# Patient Record
Sex: Male | Born: 1960 | Race: Black or African American | Hispanic: No | Marital: Married | Smoking: Current every day smoker
Health system: Southern US, Community
[De-identification: ages and names within clinical notes are randomized; demographics above are authoritative.]

## PROBLEM LIST (undated history)

## (undated) DIAGNOSIS — I10 Essential (primary) hypertension: Secondary | ICD-10-CM

## (undated) DIAGNOSIS — E7800 Pure hypercholesterolemia, unspecified: Secondary | ICD-10-CM

## (undated) DIAGNOSIS — M1990 Unspecified osteoarthritis, unspecified site: Secondary | ICD-10-CM

## (undated) HISTORY — PX: HIP SURGERY: SHX245

## (undated) HISTORY — DX: Unspecified osteoarthritis, unspecified site: M19.90

## (undated) HISTORY — PX: KNEE ARTHROSCOPY: SUR90

---

## 1998-03-03 ENCOUNTER — Encounter: Admission: RE | Admit: 1998-03-03 | Discharge: 1998-06-01 | Payer: Self-pay | Admitting: Family Medicine

## 1999-05-27 ENCOUNTER — Ambulatory Visit (HOSPITAL_COMMUNITY): Admission: RE | Admit: 1999-05-27 | Discharge: 1999-05-27 | Payer: Self-pay | Admitting: Internal Medicine

## 2002-05-31 ENCOUNTER — Emergency Department (HOSPITAL_COMMUNITY): Admission: EM | Admit: 2002-05-31 | Discharge: 2002-05-31 | Payer: Self-pay | Admitting: *Deleted

## 2004-10-31 ENCOUNTER — Emergency Department (HOSPITAL_COMMUNITY): Admission: EM | Admit: 2004-10-31 | Discharge: 2004-10-31 | Payer: Self-pay | Admitting: Emergency Medicine

## 2004-11-24 ENCOUNTER — Emergency Department (HOSPITAL_COMMUNITY): Admission: EM | Admit: 2004-11-24 | Discharge: 2004-11-24 | Payer: Self-pay | Admitting: Emergency Medicine

## 2005-06-12 ENCOUNTER — Emergency Department (HOSPITAL_COMMUNITY): Admission: EM | Admit: 2005-06-12 | Discharge: 2005-06-12 | Payer: Self-pay | Admitting: Emergency Medicine

## 2005-06-29 ENCOUNTER — Inpatient Hospital Stay (HOSPITAL_COMMUNITY): Admission: EM | Admit: 2005-06-29 | Discharge: 2005-07-05 | Payer: Self-pay | Admitting: Emergency Medicine

## 2005-07-08 ENCOUNTER — Ambulatory Visit: Payer: Self-pay | Admitting: Family Medicine

## 2005-07-16 ENCOUNTER — Ambulatory Visit: Payer: Self-pay | Admitting: Family Medicine

## 2005-07-17 ENCOUNTER — Encounter: Admission: RE | Admit: 2005-07-17 | Discharge: 2005-07-17 | Payer: Self-pay

## 2005-08-06 ENCOUNTER — Ambulatory Visit: Payer: Self-pay | Admitting: Family Medicine

## 2005-08-07 ENCOUNTER — Ambulatory Visit: Payer: Self-pay | Admitting: *Deleted

## 2005-10-28 ENCOUNTER — Ambulatory Visit: Payer: Self-pay | Admitting: Family Medicine

## 2005-12-10 ENCOUNTER — Ambulatory Visit: Payer: Self-pay | Admitting: Family Medicine

## 2006-04-16 ENCOUNTER — Ambulatory Visit: Payer: Self-pay | Admitting: Family Medicine

## 2006-10-02 ENCOUNTER — Ambulatory Visit: Payer: Self-pay | Admitting: Family Medicine

## 2006-12-03 ENCOUNTER — Ambulatory Visit: Payer: Self-pay | Admitting: Family Medicine

## 2007-01-23 ENCOUNTER — Emergency Department (HOSPITAL_COMMUNITY): Admission: EM | Admit: 2007-01-23 | Discharge: 2007-01-23 | Payer: Self-pay | Admitting: Emergency Medicine

## 2007-06-10 ENCOUNTER — Encounter (INDEPENDENT_AMBULATORY_CARE_PROVIDER_SITE_OTHER): Payer: Self-pay | Admitting: *Deleted

## 2007-07-06 ENCOUNTER — Emergency Department (HOSPITAL_COMMUNITY): Admission: EM | Admit: 2007-07-06 | Discharge: 2007-07-06 | Payer: Self-pay | Admitting: Emergency Medicine

## 2007-07-17 ENCOUNTER — Ambulatory Visit: Payer: Self-pay | Admitting: Family Medicine

## 2007-07-17 ENCOUNTER — Encounter (INDEPENDENT_AMBULATORY_CARE_PROVIDER_SITE_OTHER): Payer: Self-pay | Admitting: Family Medicine

## 2007-07-17 LAB — CONVERTED CEMR LAB
ALT: 10 units/L (ref 0–53)
AST: 12 units/L (ref 0–37)
Albumin: 4.6 g/dL (ref 3.5–5.2)
Alkaline Phosphatase: 85 units/L (ref 39–117)
BUN: 15 mg/dL (ref 6–23)
CO2: 23 meq/L (ref 19–32)
Calcium: 9.6 mg/dL (ref 8.4–10.5)
Chloride: 102 meq/L (ref 96–112)
Cholesterol: 238 mg/dL — ABNORMAL HIGH (ref 0–200)
Creatinine, Ser: 0.98 mg/dL (ref 0.40–1.50)
Glucose, Bld: 78 mg/dL (ref 70–99)
HDL: 38 mg/dL — ABNORMAL LOW (ref >39)
LDL Cholesterol: 154 mg/dL — ABNORMAL HIGH (ref 0–99)
Potassium: 4.7 meq/L (ref 3.5–5.3)
Sodium: 137 meq/L (ref 135–145)
Total Bilirubin: 0.5 mg/dL (ref 0.3–1.2)
Total CHOL/HDL Ratio: 6.3
Total Protein: 7.7 g/dL (ref 6.0–8.3)
Triglycerides: 228 mg/dL — ABNORMAL HIGH (ref <150)
VLDL: 46 mg/dL — ABNORMAL HIGH (ref 0–40)

## 2007-08-14 ENCOUNTER — Ambulatory Visit: Payer: Self-pay | Admitting: Internal Medicine

## 2007-08-28 ENCOUNTER — Ambulatory Visit: Payer: Self-pay | Admitting: Family Medicine

## 2007-10-25 ENCOUNTER — Emergency Department (HOSPITAL_COMMUNITY): Admission: EM | Admit: 2007-10-25 | Discharge: 2007-10-25 | Payer: Self-pay | Admitting: Emergency Medicine

## 2008-02-18 ENCOUNTER — Encounter (INDEPENDENT_AMBULATORY_CARE_PROVIDER_SITE_OTHER): Payer: Self-pay | Admitting: Family Medicine

## 2008-02-18 ENCOUNTER — Ambulatory Visit: Payer: Self-pay | Admitting: Family Medicine

## 2008-02-18 LAB — CONVERTED CEMR LAB: Microalb, Ur: 0.89 mg/dL (ref 0.00–1.89)

## 2008-03-24 ENCOUNTER — Ambulatory Visit: Payer: Self-pay | Admitting: Internal Medicine

## 2008-04-03 ENCOUNTER — Emergency Department (HOSPITAL_COMMUNITY): Admission: EM | Admit: 2008-04-03 | Discharge: 2008-04-03 | Payer: Self-pay | Admitting: Emergency Medicine

## 2009-02-18 ENCOUNTER — Emergency Department (HOSPITAL_COMMUNITY): Admission: EM | Admit: 2009-02-18 | Discharge: 2009-02-18 | Payer: Self-pay | Admitting: Emergency Medicine

## 2009-05-18 ENCOUNTER — Emergency Department (HOSPITAL_COMMUNITY): Admission: EM | Admit: 2009-05-18 | Discharge: 2009-05-18 | Payer: Self-pay | Admitting: Emergency Medicine

## 2009-07-21 ENCOUNTER — Emergency Department (HOSPITAL_COMMUNITY): Admission: EM | Admit: 2009-07-21 | Discharge: 2009-07-21 | Payer: Self-pay | Admitting: Emergency Medicine

## 2010-09-26 ENCOUNTER — Emergency Department (HOSPITAL_COMMUNITY)
Admission: EM | Admit: 2010-09-26 | Discharge: 2010-09-26 | Payer: Self-pay | Source: Home / Self Care | Admitting: Emergency Medicine

## 2010-09-26 LAB — GLUCOSE, CAPILLARY: Glucose-Capillary: 112 mg/dL — ABNORMAL HIGH (ref 70–99)

## 2010-12-27 LAB — GLUCOSE, CAPILLARY: Glucose-Capillary: 164 mg/dL — ABNORMAL HIGH (ref 70–99)

## 2011-01-01 LAB — GLUCOSE, CAPILLARY: Glucose-Capillary: 107 mg/dL — ABNORMAL HIGH (ref 70–99)

## 2011-02-08 NOTE — H&P (Signed)
NAMEMUHAMMADALI, RIES NO.:  [id number]   MEDICAL RECORD NO.:  [id number]          PATIENT TYPE:  INP   LOCATION:  0154                         FACILITY:  [facility]   PHYSICIAN:  Marcie Mowers, M.D.DATE OF BIRTH:  22-Nov-1960   DATE OF ADMISSION:  06/29/2005  DATE OF DISCHARGE:                                HISTORY & PHYSICAL   CHIEF COMPLAINT:  Cramps in his abdomen, arms, legs, generalized weakness.   HISTORY OF PRESENT ILLNESS:  This is a 50 year old African-American male  with a past medical history of hyperlipidemia, who was taking Lipitor and  stopped about three to four years ago secondary to loss to medical insurance  coverage.  Two weeks ago, he had bronchitis and presented to the emergency  department on June 12, 2005, at which time he was found to have high  blood pressure and was started on hydrochlorothiazide.  The patient was  discharged to home, but was told that he might also have diabetes.  He was  given a list of primary care physicians in the community that he should  follow up with, although the patient never made an appointment.  Last the  five days, the patient has been feeling very weak, nauseated, there has been  cramping in his arms, legs, and abdomen.  He also reports frequent  urination, he has been urinating around 15 times per night.  He also has  frequent _____________.  He came to the emergency department today secondary  to all the above symptoms.   PAST MEDICAL HISTORY:  1.  Hypertension.  2.  Hypercholesterolemia.   PAST SURGICAL HISTORY:  Overall noncontributory.  There has been a right hip  dislocation surgery for right hip dislocation, and then a right knee  arthroscopy due to a sport's injury.   MEDICATIONS:  Hydrochlorothiazide with unknown dose.   ALLERGIES:  No known drug allergies.   FAMILY HISTORY:  The patient's mother has diabetes.  Otherwise, there is no  significant family history.   SOCIAL HISTORY:   The patient smokes for about 15 years, almost one pack per  day, occasional use of alcohol, no use of illicit drugs.  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  He lives with his  wife.   REVIEW OF SYSTEMS:  The patient denies any chest pain, shortness of breath,  does not have any headache at present.  He did have some blurry vision on  and off over the last five days.  At this time, his vision is fine.  He does  not have any abdominal pain or change in his bowel movements.  He does not  have any lower extremity edema.   PHYSICAL EXAMINATION:  GENERAL APPEARANCE:  This is an obese-looking African-  American male, who does not appear to be in acute distress.  VITAL SIGNS:  His temperature is 98.5 degrees, blood pressure is 131/87,  pulse is 112 per minute, respirations are 18 per minute, he is saturating  95% on room air.  HEENT:  His head is atraumatic.  Oral mucosa is moist.  Pharynx is normal.  Extraocular  movements were intact.  Pupils equal, round, reactive to light.  NECK:  Supple, no jugular venous distention, no lymphadenopathy, no carotid  bruit.  CARDIOVASCULAR:  S1 and S2 are normal, no murmurs.  PULMONARY:  Clear to auscultation bilaterally.  ABDOMEN:  Soft, nontender, normoactive bowel sounds in all four quadrants,  no hepatosplenomegaly appreciated, Murphy's sign is negative.  EXTREMITIES:  There is no cyanosis, clubbing, or edema.  NEUROLOGIC:  Cranial nerves II-XII intact.  Motor strength is 5/5 in all  extremities.  Deep tendon reflexes are 2+.  Overall neurological examination  appears to be within normal limits.   LABORATORY DATA:  His white count is 20.1, with 82% neutrophils, 14%  lymphocytes, and 2% monocytes.  Hemoglobin is 17, hematocrit is 49.3,  platelet count is 270,000.  Sodium is 128, potassium is 5.1, chloride is 90,  CO2 is 19, BUN 17, creatinine 1.5, his blood sugar is 765, his calcium is  10.3, anion gap is 9.  Total protein  8.4, albumin 4.1, AST is elevated at  50, ALT is elevated at 62, alkaline phosphatase is elevated at 155, total  bilirubin is elevated at 1.6.  Urinalysis shows presence of ketones, glucose  more than 1000, protein is negative.  There is trace leukocyte esterase and  nitrites are negative.  Serum ketones are present with small amount of serum  acetone.  A 12-lead ECG and chest x-ray are pending at this time.   ASSESSMENT AND PLAN:  1.  Diabetic ketoacidosis.  This is secondary to discontinuation of diabetic      medications.  At this time, I will admit the patient to intensive care      unit, start him on intravenous fluids normal saline 500 cc per hour for      the first two hours, as the patient has already received 2x bolus in the      emergency department.  Then we will continue intravenous fluids at 250      cc/hour for the next four hours, and then reassess.  He will have his      basic metabolic panel checked every two hours, I will check his arterial      blood gases at bedtime.  Also, we will check every two hours his      magnesium phosphates.  I will start him on an insulin drip at 0.1      units/kg/hour, and check capillary blood glucose every one hour.  He      will be kept n.p.o.  I will add potassium supplements to his intravenous      fluids.  I will check his 12-lead ECG, hemoglobin A1C, fasting lipids,      and a chest x-ray.  At this time, it is possible that his episode of      diabetic ketoacidosis could have been precipitated secondary to recent      history of bronchitis.  This is a new diagnosis of diabetes for the      patient.  2.  Abnormal liver function tests.  The patient has no abdominal symptoms      and signs whatsoever.  I will repeat his liver function tests in the      morning.  If deemed necessary, a right upper quadrant ultrasound can be      obtained. 3.  Hypertension.  I will start him on an ACE inhibitor.  4.  Prophylaxis for peptic ulcer disease  and deep vein thrombosis.  I will  start him on Protonix and Lovenox.   Once the patient is able to take p.o., he will be started on aspirin 81 mg  daily.           ______________________________  Marcie Mowers, M.D.     PMJ/MEDQ  D:  06/30/2005  T:  06/30/2005  Job:  [id number]

## 2011-02-08 NOTE — Discharge Summary (Signed)
Brendan Lee, Brendan Lee NO.:  [id number]   MEDICAL RECORD NO.:  [id number]          PATIENT TYPE:  INP   LOCATION:  1614                         FACILITY:  [facility]   PHYSICIAN:  Isidor Holts, M.D.  DATE OF BIRTH:  08-12-1961   DATE OF ADMISSION:  06/29/2005  DATE OF DISCHARGE:                                 DISCHARGE SUMMARY   DISCHARGE DIAGNOSES:  1.  Recently diagnosed type 2 diabetes mellitus.  2.  Diabetic ketoacidosis.  3.  Hypertension.  4.  Obesity.  5.  Hypertriglyceridemia.  6.  Fatty liver/nonalcoholic steatohepatitis.   DISCHARGE MEDICATIONS:  1.  Aspirin 81 mg p.o. daily (over-the-counter).  2.  Lopid (Gemfibrozil) 600 mg p.o. b.i.d.  3.  Metformin 1000 mg p.o. b.i.d.  4.  Lisinopril 20 mg p.o. daily.  5.  Lantus insulin 35 units subcutaneously b.i.d.  6.  Sliding scale insulin with NovoLog as follows:  CBG 60-100, 0 insulin;      CBG 101-150, 3 units; CBG 151-200, 4 units; CBG 201-250, 8 units; CBG      251-300, 12 units; CBG 301-350, 16 units; CBG over 350, 20 units.  7.  Hydrochlorothiazide - this has been discontinued.   PROCEDURES:  1.  Chest x-ray dated June 29, 2005. This showed no acute cardiopulmonary      disease.  2.  Abdominal ultrasound scan dated July 01, 2005. This showed no evidence      of gallstones or biliary dilatation. However, there was diffuse fatty      infiltration of the liver. There was no evidence of splenomegaly.   CONSULTATIONS:  None.   ADMISSION HISTORY:  As in H&P notes of June 29, 2005. However, in brief,  this is a 50 year old male, with known history of dyslipidemia, recently  diagnosed hypertension, noted in the emergency department on June 12, 2005, when he was seen because of acute bronchitis. The patient was also  noted at that time to have elevated blood glucose. Was given instructions to  follow up with primary care physician in the community but the patient never  made an appointment  and presented with 5-day history of cramps in abdomen,  arms, legs, associated with general weakness, as well as urinary frequency.  He was admitted for further evaluation, investigation, and management.   CLINICAL COURSE:  #1 - TYPE 2 DIABETES MELLITUS, RECENTLY DIAGNOSED. The  patient was seen in the emergency department on June 12, 2005,  presumably for an episode of acute bronchitis. At that time, he was noted to  have elevated glucose, but however, failed outpatient follow-up. The patient  now presents with generalized weakness, cramps, and polyuria, and was found  to have a blood glucose level of 765. In addition white cell count was found  to be elevated at 20.1. The patient was mildly dehydrated with a BUN of 17,  creatinine of 1.5, CO2 was 19. The patient did have an anion gap of 9. Serum  ketones were found to be positive. The patient was therefore deemed to have  mild diabetes ketoacidosis, and was therefore managed in the step-down unit  with aggressive intravenous fluid hydration, intravenous insulin infusion  per [employer] protocol, with satisfactory clinical response. Septic  workup was done. Chest x-ray was found to be negative, as was urinalysis.  Blood culture showed no growth. The patient responded satisfactorily  clinically to above measures. As of June 30, 2005, his CBGs were much  improved at 307 to 283. The patient was eating and was largely asymptomatic.  He was therefore switched to subcutaneous sliding scale insulin,  carbohydrate modified diet, and scheduled b.i.d. subcutaneous Lantus. During  the course of his hospital stay, his diabetes mellitus proved somewhat  difficult to control, necessitating upward titration of Lantus insulin. As  of July 05, 2005, his CBGs are in the high 200s and low 300s. He has  undergone diabetic teaching, shown appropriate videos, had nutritional  consultation. He is likely to need close follow-up and arrangements have   been put in place for follow-up at [facility] on July 08, 2005. Of note,  insulin C peptid levels was 1.04, i.e. within normal limits. His hemoglobin  A1c was 11.1%.   #2 - DYSLIPIDEMIA/HYPERTRIGLYCERIDEMIA. Fasting lipid profile on July 02, 2005, demonstrated the following findings:  Total cholesterol 271,  triglyceride 1244, HDL 18, LDL not calculated. These findings are consistent  with marked hypertriglyceridemia. The patient was therefore commenced on  Lopid 600 mg p.o. b.i.d. and a lipid-lowering diet. As of July 05, 2005,  repeat serum triglyceride is improved at 453.   #3 - HYPERTENSION. This was adequately controlled with lisinopril 20 mg p.o.  daily. Note, the patient's hydrochlorothiazide has been discontinued.   #4 - NONALCOHOLIC STEATOHEPATITIS. At the time of presentation, the patient  was found to have abnormal LFTs with an AST of 50, an ALT of 62, alkaline  phosphatase of 155. He underwent abdominal ultrasound scan on July 01, 2005, which showed extensive fatty infiltration of the liver. This is  consistent with the patient's history of diabetes mellitus,  hypertriglyceridemia, and obesity. Thyroid profile was normal with a TSH of  1.517. The patient has been recommended weight loss. It is also expected  that improvement in his blood glucose control will be beneficial. He has  been recommended exercise and is currently on Glucophage 1 g p.o. b.i.d. It  is pertinent to note that viral hepatitis profile was negative.   #5 - OBESITY/POSSIBLE OBSTRUCTIVE SLEEP APNEA SYNDROME. The patient admits  to snoring at night and occasional awakening. It is not clear whether he has  been observed to have apneic episodes. However, he has appropriate body  habitus for this, acanthosis nigricans is noted. It is felt that the patient  will benefit from evaluation for sleep apnoea syndrome on an outpatient basis. We expect his primary M.D. to arrange this.   DISPOSITION:   The patient is discharged in satisfactory condition on July 05, 2005.   DIET:  Carbohydrate modified/healthy heart diet.   ACTIVITY:  No restrictions.   WOUND CARE:  Not applicable.   PAIN MANAGEMENT:  Not applicable.   FOLLOW-UP INSTRUCTIONS:  The patient is instructed/arrangements have been  put in place for follow-up at [facility] on July 08, 2005.   SPECIAL INSTRUCTIONS:  It is expected that the patient's primary M.D. will  evaluate the patient for possible sleep apnea syndrome and arrange a study  if indicated. This has been communicated to patient and he has verbalized  understanding. The patient is expected to return to regular duties on  July 11, 2005.  Isidor Holts, M.D.  Electronically Signed     CO/MEDQ  D:  07/05/2005  T:  07/05/2005  Job:  [id number]   cc:   Shelda Jakes, MD  [address]  [place], [place] [address]   [facility] Internal Medicine Department

## 2011-04-19 ENCOUNTER — Emergency Department (HOSPITAL_COMMUNITY)
Admission: EM | Admit: 2011-04-19 | Discharge: 2011-04-19 | Disposition: A | Discharge status: Home / Self Care | Payer: Self-pay | Attending: Emergency Medicine | Admitting: Emergency Medicine

## 2011-04-19 ENCOUNTER — Emergency Department (HOSPITAL_COMMUNITY): Payer: Self-pay

## 2011-04-19 DIAGNOSIS — K219 Gastro-esophageal reflux disease without esophagitis: Secondary | ICD-10-CM | POA: Insufficient documentation

## 2011-04-19 DIAGNOSIS — I10 Essential (primary) hypertension: Secondary | ICD-10-CM | POA: Insufficient documentation

## 2011-04-19 DIAGNOSIS — E119 Type 2 diabetes mellitus without complications: Secondary | ICD-10-CM | POA: Insufficient documentation

## 2011-04-19 DIAGNOSIS — R079 Chest pain, unspecified: Secondary | ICD-10-CM | POA: Insufficient documentation

## 2011-04-19 LAB — URINALYSIS, ROUTINE W REFLEX MICROSCOPIC
Bilirubin Urine: NEGATIVE
Glucose, UA: NEGATIVE mg/dL
Hgb urine dipstick: NEGATIVE
Ketones, ur: NEGATIVE mg/dL
Leukocytes, UA: NEGATIVE
Nitrite: NEGATIVE
Protein, ur: NEGATIVE mg/dL
Specific Gravity, Urine: 1.018 (ref 1.005–1.030)
Urobilinogen, UA: 1 mg/dL (ref 0.0–1.0)
pH: 5.5 (ref 5.0–8.0)

## 2011-04-19 LAB — COMPREHENSIVE METABOLIC PANEL
ALT: 25 U/L (ref 0–53)
AST: 22 U/L (ref 0–37)
Albumin: 3.7 g/dL (ref 3.5–5.2)
Alkaline Phosphatase: 101 U/L (ref 39–117)
BUN: 18 mg/dL (ref 6–23)
CO2: 27 mEq/L (ref 19–32)
Calcium: 9.8 mg/dL (ref 8.4–10.5)
Chloride: 101 mEq/L (ref 96–112)
Creatinine, Ser: 1.27 mg/dL (ref 0.50–1.35)
GFR calc Af Amer: >60 mL/min (ref >60)
GFR calc non Af Amer: >60 mL/min (ref >60)
Glucose, Bld: 111 mg/dL — ABNORMAL HIGH (ref 70–99)
Potassium: 4.2 mEq/L (ref 3.5–5.1)
Sodium: 136 mEq/L (ref 135–145)
Total Bilirubin: 0.7 mg/dL (ref 0.3–1.2)
Total Protein: 7.9 g/dL (ref 6.0–8.3)

## 2011-04-19 LAB — PRO B NATRIURETIC PEPTIDE: Pro B Natriuretic peptide (BNP): 2064 pg/mL — ABNORMAL HIGH (ref 0–125)

## 2011-04-19 LAB — DIFFERENTIAL
Basophils Absolute: 0 10*3/uL (ref 0.0–0.1)
Basophils Relative: 0 % (ref 0–1)
Eosinophils Absolute: 0.2 10*3/uL (ref 0.0–0.7)
Eosinophils Relative: 2 % (ref 0–5)
Lymphocytes Relative: 26 % (ref 12–46)
Lymphs Abs: 2.8 10*3/uL (ref 0.7–4.0)
Monocytes Absolute: 0.5 10*3/uL (ref 0.1–1.0)
Monocytes Relative: 4 % (ref 3–12)
Neutro Abs: 7.2 10*3/uL (ref 1.7–7.7)
Neutrophils Relative %: 67 % (ref 43–77)

## 2011-04-19 LAB — CBC
HCT: 43.6 % (ref 39.0–52.0)
Hemoglobin: 14.3 g/dL (ref 13.0–17.0)
MCH: 29.9 pg (ref 26.0–34.0)
MCHC: 32.8 g/dL (ref 30.0–36.0)
MCV: 91 fL (ref 78.0–100.0)
Platelets: 170 10*3/uL (ref 150–400)
RBC: 4.79 MIL/uL (ref 4.22–5.81)
RDW: 14.5 % (ref 11.5–15.5)
WBC: 10.7 10*3/uL — ABNORMAL HIGH (ref 4.0–10.5)

## 2011-04-19 LAB — GLUCOSE, CAPILLARY: Glucose-Capillary: 105 mg/dL — ABNORMAL HIGH (ref 70–99)

## 2011-04-19 LAB — LIPASE, BLOOD: Lipase: 38 U/L (ref 11–59)

## 2011-07-04 LAB — POCT CARDIAC MARKERS
CKMB, poc: <1 — ABNORMAL LOW
Myoglobin, poc: 63.1
Operator id: 1192
Troponin i, poc: <0.05

## 2011-07-04 LAB — BASIC METABOLIC PANEL
BUN: 13
CO2: 28
Calcium: 9.2
Chloride: 102
Creatinine, Ser: 1.1
GFR calc Af Amer: >60
GFR calc non Af Amer: >60
Glucose, Bld: 75
Potassium: 4.6
Sodium: 137

## 2011-07-04 LAB — CBC
HCT: 48
Hemoglobin: 16.6
MCHC: 34.7
MCV: 86.3
Platelets: 250
RBC: 5.57
RDW: 14.3 — ABNORMAL HIGH
WBC: 12.6 — ABNORMAL HIGH

## 2012-04-01 ENCOUNTER — Inpatient Hospital Stay (HOSPITAL_COMMUNITY)
Admission: EM | Admit: 2012-04-01 | Discharge: 2012-04-05 | DRG: 293 | Disposition: A | Discharge status: Home / Self Care | Payer: 59 | Attending: Family Medicine | Admitting: Family Medicine

## 2012-04-01 ENCOUNTER — Emergency Department (HOSPITAL_COMMUNITY): Payer: Self-pay

## 2012-04-01 ENCOUNTER — Encounter (HOSPITAL_COMMUNITY): Payer: Self-pay | Admitting: *Deleted

## 2012-04-01 DIAGNOSIS — Z9119 Patient's noncompliance with other medical treatment and regimen: Secondary | ICD-10-CM

## 2012-04-01 DIAGNOSIS — I161 Hypertensive emergency: Secondary | ICD-10-CM | POA: Diagnosis present

## 2012-04-01 DIAGNOSIS — Z91199 Patient's noncompliance with other medical treatment and regimen due to unspecified reason: Secondary | ICD-10-CM

## 2012-04-01 DIAGNOSIS — I5030 Unspecified diastolic (congestive) heart failure: Secondary | ICD-10-CM | POA: Diagnosis present

## 2012-04-01 DIAGNOSIS — I10 Essential (primary) hypertension: Secondary | ICD-10-CM | POA: Diagnosis present

## 2012-04-01 DIAGNOSIS — E7800 Pure hypercholesterolemia, unspecified: Secondary | ICD-10-CM | POA: Diagnosis present

## 2012-04-01 DIAGNOSIS — Z6838 Body mass index (BMI) 38.0-38.9, adult: Secondary | ICD-10-CM

## 2012-04-01 DIAGNOSIS — E669 Obesity, unspecified: Secondary | ICD-10-CM | POA: Diagnosis present

## 2012-04-01 DIAGNOSIS — I509 Heart failure, unspecified: Secondary | ICD-10-CM | POA: Diagnosis present

## 2012-04-01 DIAGNOSIS — I110 Hypertensive heart disease with heart failure: Principal | ICD-10-CM | POA: Diagnosis present

## 2012-04-01 DIAGNOSIS — E119 Type 2 diabetes mellitus without complications: Secondary | ICD-10-CM | POA: Diagnosis present

## 2012-04-01 HISTORY — DX: Essential (primary) hypertension: I10

## 2012-04-01 HISTORY — DX: Pure hypercholesterolemia, unspecified: E78.00

## 2012-04-01 LAB — DIFFERENTIAL
Basophils Absolute: 0 10*3/uL (ref 0.0–0.1)
Basophils Relative: 0 % (ref 0–1)
Eosinophils Absolute: 0.2 10*3/uL (ref 0.0–0.7)
Monocytes Relative: 5 % (ref 3–12)
Neutro Abs: 7.4 10*3/uL (ref 1.7–7.7)
Neutrophils Relative %: 66 % (ref 43–77)

## 2012-04-01 LAB — CBC
Hemoglobin: 15.4 g/dL (ref 13.0–17.0)
MCH: 32.2 pg (ref 26.0–34.0)
MCHC: 34.3 g/dL (ref 30.0–36.0)
RDW: 14.4 % (ref 11.5–15.5)

## 2012-04-01 LAB — BASIC METABOLIC PANEL
BUN: 18 mg/dL (ref 6–23)
Calcium: 9.3 mg/dL (ref 8.4–10.5)
Creatinine, Ser: 1.22 mg/dL (ref 0.50–1.35)
GFR calc Af Amer: 78 mL/min — ABNORMAL LOW (ref >90)
GFR calc non Af Amer: 68 mL/min — ABNORMAL LOW (ref >90)
Glucose, Bld: 99 mg/dL (ref 70–99)
Potassium: 4.2 mEq/L (ref 3.5–5.1)

## 2012-04-01 LAB — POCT I-STAT TROPONIN I

## 2012-04-01 MED ORDER — SODIUM CHLORIDE 0.9 % IJ SOLN: 3.0000 mL | INTRAMUSCULAR | Status: DC | PRN

## 2012-04-01 MED ORDER — ENALAPRILAT 1.25 MG/ML IV SOLN
2.5000 mg | Freq: Once | INTRAVENOUS | Status: AC
  Administered 2012-04-01: 2.5 mg via INTRAVENOUS
  Filled 2012-04-01 (×2): qty 2

## 2012-04-01 MED ORDER — NITROGLYCERIN 2 % TD OINT
1.0000 [in_us] | TOPICAL_OINTMENT | Freq: Four times a day (QID) | TRANSDERMAL | Status: DC
  Administered 2012-04-01: 1 [in_us] via TOPICAL
  Filled 2012-04-01: qty 30

## 2012-04-01 MED ORDER — FUROSEMIDE 10 MG/ML IJ SOLN
40.0000 mg | Freq: Once | INTRAMUSCULAR | Status: AC
  Administered 2012-04-01: 40 mg via INTRAVENOUS
  Filled 2012-04-01: qty 4

## 2012-04-01 NOTE — ED Notes (Addendum)
Pt c/o sob, chest pain, cough x2 weeks.  Pt stated " at night it just feels like my stomach is full, which causes me not to be able to take a breath. "

## 2012-04-01 NOTE — ED Provider Notes (Signed)
History     CSN: [id number]  Arrival date & time 04/01/12  7846   First MD Initiated Contact with Patient 04/01/12 2106      Chief Complaint  Patient presents with  . Shortness of Breath    HPI Comments: He feels like his nose will be congested and he will start coughing.  Patient is a 51 y.o. male presenting with shortness of breath. The history is provided by the patient.  Shortness of Breath  The current episode started more than 2 weeks ago. The problem occurs frequently (worse at night). The problem is mild. Relieved by: sitting up. The symptoms are aggravated by a supine position. Associated symptoms include chest pain, cough and shortness of breath. Pertinent negatives include no sore throat.  Pt has history of high blood pressure but has not been able to see a doctor or get his medications filled. Pt was supposed to be on lisinopril, lipitor, and another medication for acid reflux.  He did not need to be on diabetes medicaions any longer.  Past Medical History  Diagnosis Date  . Hypertension   . Diabetes mellitus   . High cholesterol     History reviewed. No pertinent past surgical history.  History reviewed. No pertinent family history.  History  Substance Use Topics  . Smoking status: Not on file  . Smokeless tobacco: Not on file  . Alcohol Use:       Review of Systems  HENT: Negative for sore throat.   Respiratory: Positive for cough and shortness of breath.   Cardiovascular: Positive for chest pain.  All other systems reviewed and are negative.    Allergies  Review of patient's allergies indicates no known allergies.  Home Medications  No current outpatient prescriptions on file.  BP 198/132  Pulse 109  Temp 98.3 F (36.8 C) (Oral)  Resp 20  SpO2 96%  Physical Exam  Nursing note and vitals reviewed. Constitutional: He appears well-developed and well-nourished. No distress.       Obese   HENT:  Head: Normocephalic and atraumatic.  Right  Ear: External ear normal.  Left Ear: External ear normal.  Eyes: Conjunctivae are normal. Right eye exhibits no discharge. Left eye exhibits no discharge. No scleral icterus.  Neck: Neck supple. No tracheal deviation present.  Cardiovascular: Normal rate, regular rhythm and intact distal pulses.   Pulmonary/Chest: Effort normal. No stridor. No respiratory distress. He has no wheezes. He has rales (bases).  Abdominal: Soft. Bowel sounds are normal. He exhibits no distension. There is no tenderness. There is no rebound and no guarding.  Musculoskeletal: He exhibits no edema and no tenderness.  Neurological: He is alert. He has normal strength. No sensory deficit. Cranial nerve deficit:  no gross defecits noted. He exhibits normal muscle tone. He displays no seizure activity. Coordination normal.  Skin: Skin is warm and dry. No rash noted.  Psychiatric: He has a normal mood and affect.    ED Course  Procedures (including critical care time)  Rate: 100  Rhythm: normal sinus rhythm  QRS Axis: normal  Intervals: normal  ST/T Wave abnormalities: normal  Conduction Disutrbances:none  Narrative Interpretation: bi-atrial abnormalities, probable LVH  Old EKG Reviewed: none available  Labs Reviewed  PRO B NATRIURETIC PEPTIDE - Abnormal; Notable for the following:    Pro B Natriuretic peptide (BNP) 3346.0 (*)     All other components within normal limits  BASIC METABOLIC PANEL - Abnormal; Notable for the following:    Sodium 133 (*)  GFR calc non Af Amer 68 (*)     GFR calc Af Amer 78 (*)     All other components within normal limits  CBC - Abnormal; Notable for the following:    WBC 11.1 (*)     All other components within normal limits  DIFFERENTIAL  POCT I-STAT TROPONIN I   Dg Chest 2 View  04/01/2012  *RADIOLOGY REPORT*  Clinical Data: Short of breath  CHEST - 2 VIEW  Comparison: 04/19/2011  Findings: Mild cardiomegaly.  Vascular congestion.  Kerley B lines at both lung bases  compatible with mild interstitial edema.  Small pleural effusions.  IMPRESSION: Mild CHF.  Original Report Authenticated By: Donavan Burnet, M.D.     1. Hypertensive CHF      Medications  nitroGLYCERIN (NITROGLYN) 2 % ointment 1 inch (1 inch Topical Given 04/01/12 2307)  sodium chloride 0.9 % injection 3 mL (not administered)  enalaprilat (VASOTEC) injection 2.5 mg (not administered)  furosemide (LASIX) injection 40 mg (40 mg Intravenous Given 04/01/12 2211)     MDM  Pt with CHF, likely related to uncontrolled HTN.  IV diuretics, nitrates, and ace inhibitor started.  Will admit for further treatment.  Pt remains stable and in no distress.        Celene Kras, MD 04/01/12 [phone]

## 2012-04-01 NOTE — ED Notes (Addendum)
Pt in c/o shortness of breath, worse at night with cough x2 weeks, states it feels like his bronchitis in the past, off BP medication x3 months, states his cough is productive, c/o chest wall pain with coughing

## 2012-04-01 NOTE — ED Notes (Signed)
EKG given to EDP, Knapp,J.MD. 

## 2012-04-02 ENCOUNTER — Encounter (HOSPITAL_COMMUNITY): Payer: Self-pay | Admitting: Internal Medicine

## 2012-04-02 DIAGNOSIS — I509 Heart failure, unspecified: Principal | ICD-10-CM

## 2012-04-02 DIAGNOSIS — I110 Hypertensive heart disease with heart failure: Secondary | ICD-10-CM | POA: Diagnosis present

## 2012-04-02 DIAGNOSIS — I161 Hypertensive emergency: Secondary | ICD-10-CM | POA: Diagnosis present

## 2012-04-02 DIAGNOSIS — Z9119 Patient's noncompliance with other medical treatment and regimen: Secondary | ICD-10-CM

## 2012-04-02 DIAGNOSIS — Z91199 Patient's noncompliance with other medical treatment and regimen due to unspecified reason: Secondary | ICD-10-CM

## 2012-04-02 DIAGNOSIS — I5030 Unspecified diastolic (congestive) heart failure: Secondary | ICD-10-CM

## 2012-04-02 DIAGNOSIS — E669 Obesity, unspecified: Secondary | ICD-10-CM | POA: Diagnosis present

## 2012-04-02 LAB — CARDIAC PANEL(CRET KIN+CKTOT+MB+TROPI)
CK, MB: 2.3 ng/mL (ref 0.3–4.0)
CK, MB: 2.3 ng/mL (ref 0.3–4.0)
Relative Index: 2.1 (ref 0.0–2.5)
Total CK: 111 U/L (ref 7–232)
Troponin I: <0.3 ng/mL (ref <0.30)
Troponin I: <0.3 ng/mL (ref <0.30)
Troponin I: <0.3 ng/mL (ref <0.30)

## 2012-04-02 LAB — CBC
MCH: 32.2 pg (ref 26.0–34.0)
MCV: 94.4 fL (ref 78.0–100.0)
Platelets: 181 10*3/uL (ref 150–400)
RBC: 4.84 MIL/uL (ref 4.22–5.81)
RDW: 14.4 % (ref 11.5–15.5)

## 2012-04-02 LAB — CREATININE, SERUM
Creatinine, Ser: 1.17 mg/dL (ref 0.50–1.35)
GFR calc non Af Amer: 71 mL/min — ABNORMAL LOW (ref >90)

## 2012-04-02 LAB — BASIC METABOLIC PANEL
CO2: 28 mEq/L (ref 19–32)
Calcium: 9.7 mg/dL (ref 8.4–10.5)
Creatinine, Ser: 1.42 mg/dL — ABNORMAL HIGH (ref 0.50–1.35)
GFR calc non Af Amer: 56 mL/min — ABNORMAL LOW (ref >90)
Glucose, Bld: 114 mg/dL — ABNORMAL HIGH (ref 70–99)
Sodium: 136 mEq/L (ref 135–145)

## 2012-04-02 LAB — LIPID PANEL
LDL Cholesterol: 124 mg/dL — ABNORMAL HIGH (ref 0–99)
VLDL: 27 mg/dL (ref 0–40)

## 2012-04-02 LAB — HEMOGLOBIN A1C
Hgb A1c MFr Bld: 5.7 % — ABNORMAL HIGH (ref <5.7)
Mean Plasma Glucose: 117 mg/dL — ABNORMAL HIGH (ref <117)

## 2012-04-02 LAB — TSH: TSH: 3.337 u[IU]/mL (ref 0.350–4.500)

## 2012-04-02 MED ORDER — LABETALOL HCL 5 MG/ML IV SOLN
20.0000 mg | Freq: Once | INTRAVENOUS | Status: AC
  Administered 2012-04-02: 20 mg via INTRAVENOUS
  Filled 2012-04-02: qty 4

## 2012-04-02 MED ORDER — HYDRALAZINE HCL 20 MG/ML IJ SOLN
10.0000 mg | Freq: Once | INTRAMUSCULAR | Status: AC
  Administered 2012-04-02: 10 mg via INTRAVENOUS
  Filled 2012-04-02: qty 1
  Filled 2012-04-02: qty 0.5

## 2012-04-02 MED ORDER — MORPHINE SULFATE 2 MG/ML IJ SOLN
2.0000 mg | INTRAMUSCULAR | Status: DC | PRN
  Administered 2012-04-02: 2 mg via INTRAVENOUS
  Filled 2012-04-02: qty 1

## 2012-04-02 MED ORDER — LISINOPRIL 20 MG PO TABS
20.0000 mg | ORAL_TABLET | Freq: Every day | ORAL | Status: DC
  Administered 2012-04-02 – 2012-04-05 (×4): 20 mg via ORAL
  Filled 2012-04-02 (×4): qty 1

## 2012-04-02 MED ORDER — ZOLPIDEM TARTRATE 5 MG PO TABS
5.0000 mg | ORAL_TABLET | Freq: Every evening | ORAL | Status: DC | PRN
  Administered 2012-04-05 (×2): 5 mg via ORAL
  Filled 2012-04-02 (×2): qty 1

## 2012-04-02 MED ORDER — METOPROLOL SUCCINATE ER 50 MG PO TB24
50.0000 mg | ORAL_TABLET | Freq: Every day | ORAL | Status: DC
  Filled 2012-04-02: qty 1

## 2012-04-02 MED ORDER — SODIUM CHLORIDE 0.9 % IV SOLN: 250.0000 mL | INTRAVENOUS | Status: DC | PRN

## 2012-04-02 MED ORDER — ONDANSETRON HCL 4 MG PO TABS: 4.0000 mg | ORAL_TABLET | Freq: Four times a day (QID) | ORAL | Status: DC | PRN

## 2012-04-02 MED ORDER — SODIUM CHLORIDE 0.9 % IJ SOLN
3.0000 mL | Freq: Two times a day (BID) | INTRAMUSCULAR | Status: DC
  Administered 2012-04-02 – 2012-04-04 (×3): 3 mL via INTRAVENOUS

## 2012-04-02 MED ORDER — SODIUM CHLORIDE 0.9 % IJ SOLN
3.0000 mL | Freq: Two times a day (BID) | INTRAMUSCULAR | Status: DC
  Administered 2012-04-02 – 2012-04-05 (×7): 3 mL via INTRAVENOUS

## 2012-04-02 MED ORDER — METOPROLOL SUCCINATE ER 25 MG PO TB24
25.0000 mg | ORAL_TABLET | Freq: Every day | ORAL | Status: DC
  Administered 2012-04-02: 25 mg via ORAL
  Filled 2012-04-02: qty 1

## 2012-04-02 MED ORDER — ASPIRIN EC 325 MG PO TBEC
325.0000 mg | DELAYED_RELEASE_TABLET | Freq: Every day | ORAL | Status: DC
  Administered 2012-04-02 – 2012-04-05 (×4): 325 mg via ORAL
  Filled 2012-04-02 (×4): qty 1

## 2012-04-02 MED ORDER — ONDANSETRON HCL 4 MG/2ML IJ SOLN: 4.0000 mg | Freq: Four times a day (QID) | INTRAMUSCULAR | Status: DC | PRN

## 2012-04-02 MED ORDER — HYDRALAZINE HCL 20 MG/ML IJ SOLN
10.0000 mg | Freq: Once | INTRAMUSCULAR | Status: AC
  Administered 2012-04-02: 10 mg via INTRAVENOUS

## 2012-04-02 MED ORDER — ACETAMINOPHEN 325 MG PO TABS
650.0000 mg | ORAL_TABLET | Freq: Four times a day (QID) | ORAL | Status: DC | PRN
  Administered 2012-04-02 – 2012-04-04 (×4): 650 mg via ORAL
  Filled 2012-04-02 (×4): qty 2

## 2012-04-02 MED ORDER — SODIUM CHLORIDE 0.9 % IJ SOLN: 3.0000 mL | INTRAMUSCULAR | Status: DC | PRN

## 2012-04-02 MED ORDER — AMLODIPINE BESYLATE 5 MG PO TABS
5.0000 mg | ORAL_TABLET | Freq: Every day | ORAL | Status: DC
  Administered 2012-04-02: 5 mg via ORAL
  Filled 2012-04-02 (×2): qty 1

## 2012-04-02 MED ORDER — ENOXAPARIN SODIUM 40 MG/0.4ML ~~LOC~~ SOLN
40.0000 mg | SUBCUTANEOUS | Status: DC
  Administered 2012-04-02 – 2012-04-05 (×4): 40 mg via SUBCUTANEOUS
  Filled 2012-04-02 (×4): qty 0.4

## 2012-04-02 NOTE — H&P (Signed)
PCP: No primary care physician   Chief Complaint: Shortness of breath   HPI: Brendan Lee is an 51 y.o. male with history of hypertension, diet-controlled diabetes, hypercholesterolemia, severe noncompliant due to cost constrained, has not seen a physician in quite a while, presents to [facility] long emergency room complaining of dyspnea and exertion but no chest pain, no pleuritic pain, no cough, fever or chills. He admitted to having some orthopnea as well along with slight pedal edema. In emergency room he was found to be severely hypertensive with systolic blood pressure of 190 and diastolic pressure at time 140. He has P pulmonale in his EKG, in sinus rhythm with no acute ST-T changes. His further workup included serum sodium of 133, creatinine of 1.22, white count of 11,000 and hemoglobin of 15.4 g per decaliter. His BNP is elevated to 3.3 thousand, and his chest x-ray shows evidence of mild congestive heart failure. His initial cardiac markers were negative. Hospitalist was asked to admit him because of malignant hypertension with likely diastolic congestive heart failure.  Rewiew of Systems:  The patient denies anorexia, fever, weight loss,, vision loss, decreased hearing, hoarseness, chest pain, syncope, , peripheral edema, balance deficits, hemoptysis, abdominal pain, melena, hematochezia, severe indigestion/heartburn, hematuria, incontinence, genital sores, muscle weakness, suspicious skin lesions, transient blindness, difficulty walking, depression, unusual weight change, abnormal bleeding, enlarged lymph nodes, angioedema, and breast masses.    Past Medical History  Diagnosis Date  . Hypertension   . Diabetes mellitus   . High cholesterol     History reviewed. No pertinent past surgical history.  Medications:  HOME MEDS: Prior to Admission medications   Not on File     Allergies:  No Known Allergies  Social History:   does not have a smoking history on file. He does  not have any smokeless tobacco history on file. His alcohol and drug histories not on file.  Family History: History reviewed. No pertinent family history.   Physical Exam: Filed Vitals:   04/01/12 2346 04/02/12 0015 04/02/12 0030 04/02/12 0145  BP: 196/121 173/116 168/110 166/115  Pulse: 97 93 94 90  Temp:      TempSrc:      Resp: 18 19    SpO2: 98% 97% 96% 97%   Blood pressure 166/115, pulse 90, temperature 98.3 F (36.8 C), temperature source Oral, resp. rate 19, SpO2 97.00%.  GEN:  Pleasant overweight person lying in the stretcher in no acute distress; cooperative with exam PSYCH:  alert and oriented x4; does not appear anxious or depressed; affect is appropriate. HEENT: Mucous membranes pink and anicteric; PERRLA; EOM intact; no cervical lymphadenopathy nor thyromegaly or carotid bruit; no JVD; Breasts:: Not examined CHEST WALL: No tenderness CHEST: Normal respiration, no wheezes but bilateral crackles at both bases. HEART: Regular rate and rhythm; no murmurs rubs or gallops BACK: No kyphosis or scoliosis; no CVA tenderness ABDOMEN: Obese, soft non-tender; no masses, no organomegaly, normal abdominal bowel sounds; no pannus; no intertriginous candida. Rectal Exam: Not done EXTREMITIES: No bone or joint deformity; age-appropriate arthropathy of the hands and knees; 2+ edema; no ulcerations. Genitalia: not examined PULSES: 2+ and symmetric SKIN: Normal hydration no rash or ulceration CNS: Cranial nerves 2-12 grossly intact no focal lateralizing neurologic deficit   Labs & Imaging Results for orders placed during the hospital encounter of 04/01/12 (from the past 48 hour(s))  BASIC METABOLIC PANEL     Status: Abnormal   Collection Time   04/01/12  8:10 PM  Component Value Range Comment   Sodium 133 (*) 135 - 145 mEq/L    Potassium 4.2  3.5 - 5.1 mEq/L MODERATE HEMOLYSIS   Chloride 100  96 - 112 mEq/L    CO2 22  19 - 32 mEq/L    Glucose, Bld 99  70 - 99 mg/dL    BUN  18  6 - 23 mg/dL    Creatinine, Ser 1.61  0.50 - 1.35 mg/dL    Calcium 9.3  8.4 - 09.6 mg/dL    GFR calc non Af Amer 68 (*) >90 mL/min    GFR calc Af Amer 78 (*) >90 mL/min   CBC     Status: Abnormal   Collection Time   04/01/12  8:10 PM      Component Value Range Comment   WBC 11.1 (*) 4.0 - 10.5 K/uL    RBC 4.78  4.22 - 5.81 MIL/uL    Hemoglobin 15.4  13.0 - 17.0 g/dL    HCT 04.5  40.9 - 81.1 %    MCV 93.9  78.0 - 100.0 fL    MCH 32.2  26.0 - 34.0 pg    MCHC 34.3  30.0 - 36.0 g/dL    RDW 91.4  78.2 - 95.6 %    Platelets 167  150 - 400 K/uL   DIFFERENTIAL     Status: Normal   Collection Time   04/01/12  8:10 PM      Component Value Range Comment   Neutrophils Relative 66  43 - 77 %    Neutro Abs 7.4  1.7 - 7.7 K/uL    Lymphocytes Relative 27  12 - 46 %    Lymphs Abs 3.0  0.7 - 4.0 K/uL    Monocytes Relative 5  3 - 12 %    Monocytes Absolute 0.6  0.1 - 1.0 K/uL    Eosinophils Relative 1  0 - 5 %    Eosinophils Absolute 0.2  0.0 - 0.7 K/uL    Basophils Relative 0  0 - 1 %    Basophils Absolute 0.0  0.0 - 0.1 K/uL   PRO B NATRIURETIC PEPTIDE     Status: Abnormal   Collection Time   04/01/12 10:10 PM      Component Value Range Comment   Pro B Natriuretic peptide (BNP) 3346.0 (*) 0 - 125 pg/mL   POCT I-STAT TROPONIN I     Status: Normal   Collection Time   04/01/12 10:21 PM      Component Value Range Comment   Troponin i, poc 0.05  0.00 - 0.08 ng/mL    Comment 3             Dg Chest 2 View  04/01/2012  *RADIOLOGY REPORT*  Clinical Data: Short of breath  CHEST - 2 VIEW  Comparison: 04/19/2011  Findings: Mild cardiomegaly.  Vascular congestion.  Kerley B lines at both lung bases compatible with mild interstitial edema.  Small pleural effusions.  IMPRESSION: Mild CHF.  Original Report Authenticated By: Donavan Burnet, M.D.      Assessment Present on Admission:  .Heart failure, diastolic, due to HTN .CHF (congestive heart failure) .Obesity   PLAN: Mr. Westley Gambles likely has  hypertensive cardiomyopathy noncompliant. He also is morbidly obese and could possibly have sleep apnea with concomitant pulmonary hypertension. He is in congestive heart failure likely in basis of diastolic dysfunction. I will admit him to telemetry,  Restart him on his lisinopril. Will give low-dose beta  blocker if it does not control his blood pressure. Will start him on an aspirin a day and rule out with serial CPKs and troponin. He was given nitro paste in the emergency room and we will continue it. He'll need an echo his heart. Outpatient he should have a sleep study, and be compliant with his medications. I asked he stop smoking immediately. Will get his fasting lipids along with glycosylated hemoglobin. He is stable, full code, and will be admitted to [facility].   Other plans as per orders.    Delawrence Fridman 04/02/2012, 2:35 AM

## 2012-04-02 NOTE — ED Notes (Signed)
Paged mid level concerning pt's b/p prior to taking to floor. Awaiting call

## 2012-04-02 NOTE — Progress Notes (Signed)
[facility] PROGRESS NOTE  Brendan Lee ZOX:[id number] DOB: 1961/04/28 DOA: 04/01/2012 PCP: Patient mentions that he does not have a pcp.  Assessment/Plan: Principal Problem:  *Heart failure, diastolic, due to HTN Active Problems:  CHF (congestive heart failure)  Medically noncompliant  Obesity  1. Heart Failure - f/u with CE x 3 last troponin < 0.03 - f/u with Echo - lisinopril added recently.  Will monitor creatinine level and may have to consider switching to ARB should creatinine continue to rise as this could represent ARF secondary to ACEI.  Will recheck creatinine this afternoon although patient was recently given lasix which may account for the higher creatinine level today. - Add amlodipine for improved blood pressure control - Follow I/O's patient has net loss of > 3 liters, pt is s/p lasix 40 mg IV x 1 time dose.  Will hold off on lasix as patient is currently breathing comfortably and would like to monitor creatinine  2. HTN - Likely has been uncontrolled for years and presumed to be leading up to number 1 - Will go ahead and add amlodipine today - Also add beta blocker today  3 Obesity - noted will recommend diet.  Code Status: Full Family Communication: No family at bedside Disposition Plan: Pending clinical improvement likely in 1-2 days.  Brief narrative: 51 y/o with poor f/u with pcp as outpatient.  Was found to have elevated BP's in the ED as well as elevated BNP.  Cardiac Enzymes ordered times 3 and Echocardiogram pending.  Consultants:  none  Procedures:  Echocardiogram pending  Antibiotics:  N/A  HPI/Subjective: Patient denies any shortness of breath, blurred vision, headaches, or chest pain.  No acute issues reported overnight.  Currently feels better.  Objective: Filed Vitals:   04/02/12 0500 04/02/12 0537 04/02/12 0640 04/02/12 0703  BP:  177/113 184/111 160/106  Pulse:  96    Temp:  98.4 F (36.9 C)    TempSrc:  Oral    Resp:   18    Height:      Weight: 133 kg (293 lb 3.4 oz)     SpO2:  96%      Intake/Output Summary (Last 24 hours) at 04/02/12 0921 Last data filed at 04/02/12 0537  Gross per 24 hour  Intake      0 ml  Output   3150 ml  Net  -3150 ml    Exam:  General: Alert, awake, oriented x3, in no acute distress. HEENT: No bruits, no goiter. Heart: Regular rate and rhythm, without murmurs, rubs, gallops. Lungs: Clear to auscultation bilaterally. Abdomen: Soft, nontender, nondistended, positive bowel sounds. Extremities: No clubbing cyanosis or edema with positive pedal pulses. Neuro: Grossly intact, nonfocal.  Data Reviewed: Basic Metabolic Panel:  Lab 04/02/12 8119 04/01/12 2010  NA 136 133*  K 3.9 4.2  CL 98 100  CO2 28 22  GLUCOSE 114* 99  BUN 18 18  CREATININE 1.42* 1.22  CALCIUM 9.7 9.3  MG -- --  PHOS -- --   Liver Function Tests: No results found for this basename: AST:5,ALT:5,ALKPHOS:5,BILITOT:5,PROT:5,ALBUMIN:5 in the last 168 hours No results found for this basename: LIPASE:5,AMYLASE:5 in the last 168 hours No results found for this basename: AMMONIA:5 in the last 168 hours CBC:  Lab 04/02/12 0323 04/01/12 2010  WBC 10.2 11.1*  NEUTROABS -- 7.4  HGB 15.6 15.4  HCT 45.7 44.9  MCV 94.4 93.9  PLT 181 167   Cardiac Enzymes:  Lab 04/02/12 0323  CKTOTAL 124  CKMB 2.3  CKMBINDEX --  TROPONINI <0.30   BNP (last 3 results)  Basename 04/01/12 2210 04/19/11 1337  PROBNP 3346.0* 2064.0*   CBG: No results found for this basename: GLUCAP:5 in the last 168 hours  No results found for this or any previous visit (from the past 240 hour(s)).   Studies: Dg Chest 2 View  04/01/2012  *RADIOLOGY REPORT*  Clinical Data: Short of breath  CHEST - 2 VIEW  Comparison: 04/19/2011  Findings: Mild cardiomegaly.  Vascular congestion.  Kerley B lines at both lung bases compatible with mild interstitial edema.  Small pleural effusions.  IMPRESSION: Mild CHF.  Original Report  Authenticated By: Donavan Burnet, M.D.    Scheduled Meds:   . amLODipine  5 mg Oral Daily  . aspirin EC  325 mg Oral Daily  . enalaprilat  2.5 mg Intravenous Once  . enoxaparin (LOVENOX) injection  40 mg Subcutaneous Q24H  . furosemide  40 mg Intravenous Once  . hydrALAZINE  10 mg Intravenous Once  . hydrALAZINE  10 mg Intravenous Once  . labetalol  20 mg Intravenous Once  . lisinopril  20 mg Oral Daily  . nitroGLYCERIN  1 inch Topical Q6H  . sodium chloride  3 mL Intravenous Q12H  . sodium chloride  3 mL Intravenous Q12H   Continuous Infusions:    Penny Pia [facility] Pager [phone]  If 8PM-7AM, please contact night-coverage [email or web address] Password [email or web address] 04/02/2012, 9:21 AM   LOS: 1 day

## 2012-04-02 NOTE — Progress Notes (Signed)
  Echocardiogram 2D Echocardiogram has been performed.  Cathie Beams 04/02/2012, 12:55 PM

## 2012-04-02 NOTE — Progress Notes (Signed)
Patient arrived to floor from ed with elevated blood pressure. Nurse practitioner notified of blood pressure trending upwards after hydralazine given x2 . New orders received and initiated. Patient refused nitropaste due it causing a headache.will continue to monitor.

## 2012-04-03 DIAGNOSIS — I10 Essential (primary) hypertension: Secondary | ICD-10-CM

## 2012-04-03 MED ORDER — HYDRALAZINE HCL 20 MG/ML IJ SOLN
10.0000 mg | Freq: Once | INTRAMUSCULAR | Status: AC
  Administered 2012-04-03: 10 mg via INTRAVENOUS
  Filled 2012-04-03: qty 1

## 2012-04-03 MED ORDER — AMLODIPINE BESYLATE 10 MG PO TABS
10.0000 mg | ORAL_TABLET | Freq: Every day | ORAL | Status: DC
  Administered 2012-04-03 – 2012-04-05 (×3): 10 mg via ORAL
  Filled 2012-04-03 (×3): qty 1

## 2012-04-03 MED ORDER — METOPROLOL SUCCINATE ER 100 MG PO TB24
100.0000 mg | ORAL_TABLET | Freq: Every day | ORAL | Status: DC
  Administered 2012-04-03 – 2012-04-04 (×2): 100 mg via ORAL
  Filled 2012-04-03 (×2): qty 1

## 2012-04-03 NOTE — Progress Notes (Signed)
Blood pressure came down in the 120 during the night but it was elevated again this a.m.. He also had 3 beats of pvc's. Patient denies any symptoms , resting quietly . Will continue to monitor

## 2012-04-03 NOTE — Progress Notes (Signed)
Talked to patient about follow up medical care. 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 Patient was active with [facility] but could not follow up on his eligibility and was disinrolled from the clinic; Patient currently goes to the [facility] on [address] Patient is agreeable to go to [facility]- apt made for April 10, 2012 at 2 pm with Dr Leticia Clas; Informed patient to bring in 2 months of pay stubs and his ID to give to Duane Boston for the financial assistance program. Prescriptions are filled at [facility]; B Lobbyist, BSN, [place].

## 2012-04-03 NOTE — Progress Notes (Signed)
[facility] PROGRESS NOTE  Brendan Lee ZOX:[id number] DOB: May 23, 1961 DOA: 04/01/2012 PCP: No primary provider on file.  Assessment/Plan: Principal Problem:  *Heart failure, diastolic, due to HTN Active Problems:  CHF (congestive heart failure)  Medically noncompliant  Obesity  Hypertensive emergency  1. Heart Failure - f/u with CE x 3 negative -Echo results reviewed and patient with mild LVH - lisinopril added recently. Last creatinine WNL's - Add amlodipine for improved blood pressure control  - Follow I/O's patient has net loss of > 3 liters, pt is s/p lasix 40 mg IV x 1 time dose. Will hold off on lasix as patient is currently breathing comfortably and would like to monitor creatinine   2. HTN  - Likely has been uncontrolled for years and presumed to be leading up to number 1  - Will go ahead and increase amlodipine dose today  - Also increase beta blocker dose today  - Will consider adding hctz should blood pressures remain elevated - Would recommend low salt diet  3 Obesity  - noted  Code Status: Full  Family Communication: No family at bedside  Disposition Plan: Pending clinical improvement likely in 1-2 days   Penny Pia  [facility] Pager [phone]. If 8PM-8AM, please contact night-coverage at [email or web address], password [facility] 04/03/2012, 8:38 PM  LOS: 2 days   Brief narrative: Please see A/P above  Consultants:  none  Procedures:  Echocardiogram  Antibiotics:  None  HPI/Subjective: Patient mentions that he feels better today.  Denies any chest pain, nausea, diaphoresis, fever, chills, abd discomfort.  Did have elevated blood pressures overnight requiring IV medication.  Objective: Filed Vitals:   04/03/12 0923 04/03/12 1048 04/03/12 1305 04/03/12 1354  BP: 150/99 152/99 154/101 148/89  Pulse: 88 85 93 82  Temp:    98 F (36.7 C)  TempSrc:    Oral  Resp:    18  Height:      Weight:      SpO2:    99%    Intake/Output  Summary (Last 24 hours) at 04/03/12 2038 Last data filed at 04/03/12 1900  Gross per 24 hour  Intake    893 ml  Output    450 ml  Net    443 ml    Exam:   General:  Pt in NAD, laying supine smiling  Cardiovascular: RRR, no MRG  Respiratory: Clear to auscultation, no wheezes  Abdomen: soft, nt, nd,  Data Reviewed: Basic Metabolic Panel:  Lab 04/02/12 8295 04/02/12 0323 04/01/12 2010  NA -- 136 133*  K -- 3.9 4.2  CL -- 98 100  CO2 -- 28 22  GLUCOSE -- 114* 99  BUN -- 18 18  CREATININE 1.17 1.42* 1.22  CALCIUM -- 9.7 9.3  MG -- -- --  PHOS -- -- --   Liver Function Tests: No results found for this basename: AST:5,ALT:5,ALKPHOS:5,BILITOT:5,PROT:5,ALBUMIN:5 in the last 168 hours No results found for this basename: LIPASE:5,AMYLASE:5 in the last 168 hours No results found for this basename: AMMONIA:5 in the last 168 hours CBC:  Lab 04/02/12 0323 04/01/12 2010  WBC 10.2 11.1*  NEUTROABS -- 7.4  HGB 15.6 15.4  HCT 45.7 44.9  MCV 94.4 93.9  PLT 181 167   Cardiac Enzymes:  Lab 04/02/12 1843 04/02/12 1040 04/02/12 0323  CKTOTAL 111 112 124  CKMB 2.3 2.3 2.3  CKMBINDEX -- -- --  TROPONINI <0.30 <0.30 <0.30   BNP (last 3 results)  Basename 04/01/12 2210 04/19/11 1337  PROBNP 3346.0* 2064.0*  CBG: No results found for this basename: GLUCAP:5 in the last 168 hours  No results found for this or any previous visit (from the past 240 hour(s)).   Studies: Dg Chest 2 View  04/01/2012  *RADIOLOGY REPORT*  Clinical Data: Short of breath  CHEST - 2 VIEW  Comparison: 04/19/2011  Findings: Mild cardiomegaly.  Vascular congestion.  Kerley B lines at both lung bases compatible with mild interstitial edema.  Small pleural effusions.  IMPRESSION: Mild CHF.  Original Report Authenticated By: Donavan Burnet, M.D.    Scheduled Meds:   . amLODipine  10 mg Oral Daily  . aspirin EC  325 mg Oral Daily  . enoxaparin (LOVENOX) injection  40 mg Subcutaneous Q24H  . hydrALAZINE   10 mg Intravenous Once  . hydrALAZINE  10 mg Intravenous Once  . labetalol  20 mg Intravenous Once  . labetalol  20 mg Intravenous Once  . lisinopril  20 mg Oral Daily  . metoprolol succinate  100 mg Oral Daily  . sodium chloride  3 mL Intravenous Q12H  . sodium chloride  3 mL Intravenous Q12H  . DISCONTD: amLODipine  5 mg Oral Daily  . DISCONTD: metoprolol succinate  50 mg Oral Daily   Continuous Infusions:   Principal Problem:  *Heart failure, diastolic, due to HTN Active Problems:  CHF (congestive heart failure)  Medically noncompliant  Obesity  Hypertensive emergency

## 2012-04-04 MED ORDER — HYDROCHLOROTHIAZIDE 25 MG PO TABS
25.0000 mg | ORAL_TABLET | Freq: Every day | ORAL | Status: DC
  Administered 2012-04-04 – 2012-04-05 (×2): 25 mg via ORAL
  Filled 2012-04-04 (×2): qty 1

## 2012-04-04 MED ORDER — METOPROLOL SUCCINATE ER 100 MG PO TB24
200.0000 mg | ORAL_TABLET | Freq: Every day | ORAL | Status: DC
  Administered 2012-04-05: 200 mg via ORAL
  Filled 2012-04-04: qty 2

## 2012-04-04 NOTE — Progress Notes (Signed)
Pt's B/P 180/110 manually.  Notified mid-level.  Orders received. Brendan Lee

## 2012-04-04 NOTE — Progress Notes (Signed)
[facility] PROGRESS NOTE  Brendan Lee ZOX:[id number] DOB: [birth year] DOA: 04/01/2012 PCP: No primary provider on file.  Assessment/Plan: Principal Problem:  *Heart failure, diastolic, due to HTN Active Problems:  CHF (congestive heart failure)  Medically noncompliant  Obesity  Hypertensive emergency  1. Heart Failure - f/u with CE x 3 negative  -Echo results reviewed and patient with mild LVH  - Follow I/O's patient has net loss of > 3 liters, pt is s/p lasix 40 mg IV x 1 time dose. Will hold off on lasix as patient is currently breathing comfortably and would like to monitor creatinine   2. HTN  - Likely has been uncontrolled for years and presumed to be leading up to number 1.  If continued elevated puts patient at risk for strokes, cardiomyopathy, etc.  Have discussed with patient and he verbalizes understanding.  Mentioned that he was not able to afford medication until this Friday.  As such placed care manager consult and discussed further. - Will go ahead and increase amlodipine dose today  - Also increase beta blocker dose today  - Will add hctz and will plan on prescribing as combination with lisinopril as outpatient to improve compliance  - Would recommend low salt diet   3 Obesity  - noted   Code Status: Full  Family Communication: No family at bedside  Disposition Plan: Pending clinical improvement likely tomorrow 7/14 with improved blood pressure.    Brendan Lee  [facility] Pager [phone]. If 8PM-8AM, please contact night-coverage at [email or web address], password [facility] 04/04/2012, 6:35 PM  LOS: 3 days   Brief narrative: Please refer to HPI  Consultants:  none  Procedures:  Echocardiogram.  Please refer to chart for results  Antibiotics:  None: n/a  HPI/Subjective: Patient mentions that he would have trouble affording medication.  Have d/c care manager.  She is looking into helping find resources to help patient.  Denies any blurred  vision, head aches, abd discomfort.  Objective: Filed Vitals:   04/04/12 0453 04/04/12 0500 04/04/12 0946 04/04/12 1425  BP: 152/102  156/100 146/95  Pulse: 75  80 82  Temp: 97.8 F (36.6 C)  98.1 F (36.7 C) 98.1 F (36.7 C)  TempSrc: Oral  Oral Oral  Resp: 20  19 20   Height:      Weight:  134.582 kg (296 lb 11.2 oz)    SpO2: 95%  98% 98%    Intake/Output Summary (Last 24 hours) at 04/04/12 1835 Last data filed at 04/04/12 1425  Gross per 24 hour  Intake   1250 ml  Output    800 ml  Net    450 ml    Exam:   General:  Pt in NAD, A & O x3  Cardiovascular: RRR, N MRG  Respiratory: Clear to auscultation, no wheezes  Abdomen: Soft, NT, ND  Data Reviewed: Basic Metabolic Panel:  Lab 04/02/12 8295 04/02/12 0323 04/01/12 2010  NA -- 136 133*  K -- 3.9 4.2  CL -- 98 100  CO2 -- 28 22  GLUCOSE -- 114* 99  BUN -- 18 18  CREATININE 1.17 1.42* 1.22  CALCIUM -- 9.7 9.3  MG -- -- --  PHOS -- -- --   Liver Function Tests: No results found for this basename: AST:5,ALT:5,ALKPHOS:5,BILITOT:5,PROT:5,ALBUMIN:5 in the last 168 hours No results found for this basename: LIPASE:5,AMYLASE:5 in the last 168 hours No results found for this basename: AMMONIA:5 in the last 168 hours CBC:  Lab 04/02/12 0323 04/01/12 2010  WBC 10.2  11.1*  NEUTROABS -- 7.4  HGB 15.6 15.4  HCT 45.7 44.9  MCV 94.4 93.9  PLT 181 167   Cardiac Enzymes:  Lab 04/02/12 1843 04/02/12 1040 04/02/12 0323  CKTOTAL 111 112 124  CKMB 2.3 2.3 2.3  CKMBINDEX -- -- --  TROPONINI <0.30 <0.30 <0.30   BNP (last 3 results)  Basename 04/01/12 2210 04/19/11 1337  PROBNP 3346.0* 2064.0*   CBG: No results found for this basename: GLUCAP:5 in the last 168 hours  No results found for this or any previous visit (from the past 240 hour(s)).   Studies: Dg Chest 2 View  04/01/2012  *RADIOLOGY REPORT*  Clinical Data: Short of breath  CHEST - 2 VIEW  Comparison: 04/19/2011  Findings: Mild cardiomegaly.   Vascular congestion.  Kerley B lines at both lung bases compatible with mild interstitial edema.  Small pleural effusions.  IMPRESSION: Mild CHF.  Original Report Authenticated By: Donavan Burnet, M.D.    Scheduled Meds:   . amLODipine  10 mg Oral Daily  . aspirin EC  325 mg Oral Daily  . enoxaparin (LOVENOX) injection  40 mg Subcutaneous Q24H  . hydrALAZINE  10 mg Intravenous Once  . hydrochlorothiazide  25 mg Oral Daily  . lisinopril  20 mg Oral Daily  . metoprolol succinate  200 mg Oral Daily  . sodium chloride  3 mL Intravenous Q12H  . sodium chloride  3 mL Intravenous Q12H  . DISCONTD: metoprolol succinate  100 mg Oral Daily   Continuous Infusions:   Principal Problem:  *Heart failure, diastolic, due to HTN Active Problems:  CHF (congestive heart failure)  Medically noncompliant  Obesity  Hypertensive emergency

## 2012-04-04 NOTE — Progress Notes (Signed)
Cm spoke with patient concerning medication assistance. Patient qualifies for indigent funds, Cm recommends outpt Heart Failure Medication Assistance program due to medications generic. Per policy indigent funds not used for generic drugs due to low cost. Heart Failure Med assistance program funded through outpatient pharmacy. Patient states able to return to [facility] outpt pharmacy upon dc to retrieve medications. 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 PCP follow up appt scheduled via [facility]. No other needs stated.   Sharmon Leyden, Rn,BSN [phone]

## 2012-04-05 DIAGNOSIS — I10 Essential (primary) hypertension: Secondary | ICD-10-CM | POA: Diagnosis present

## 2012-04-05 LAB — BASIC METABOLIC PANEL
Calcium: 9.6 mg/dL (ref 8.4–10.5)
GFR calc Af Amer: 71 mL/min — ABNORMAL LOW (ref >90)
GFR calc non Af Amer: 61 mL/min — ABNORMAL LOW (ref >90)
Potassium: 4 mEq/L (ref 3.5–5.1)
Sodium: 136 mEq/L (ref 135–145)

## 2012-04-05 MED ORDER — ISOSORBIDE MONONITRATE ER 30 MG PO TB24: 30.0000 mg | ORAL_TABLET | Freq: Every day | ORAL | Status: DC

## 2012-04-05 MED ORDER — ISOSORBIDE MONONITRATE ER 30 MG PO TB24
30.0000 mg | ORAL_TABLET | Freq: Every day | ORAL | Status: DC
  Administered 2012-04-05: 30 mg via ORAL
  Filled 2012-04-05: qty 1

## 2012-04-05 MED ORDER — HYDROCHLOROTHIAZIDE 25 MG PO TABS: 25.0000 mg | ORAL_TABLET | Freq: Every day | ORAL | Status: DC

## 2012-04-05 MED ORDER — METOPROLOL SUCCINATE ER 200 MG PO TB24: 200.0000 mg | ORAL_TABLET | Freq: Every day | ORAL | Status: DC

## 2012-04-05 MED ORDER — AMLODIPINE BESYLATE 5 MG PO TABS: 5.0000 mg | ORAL_TABLET | Freq: Every day | ORAL | Status: DC

## 2012-04-05 MED ORDER — LISINOPRIL 20 MG PO TABS: 20.0000 mg | ORAL_TABLET | Freq: Every day | ORAL | Status: DC

## 2012-04-05 NOTE — Progress Notes (Signed)
Patient provided with HF med assistance program application to take to [facility] outpatient pharmacy 04/06/12. CM faxed copy of application to [facility] outpt at [phone]. Patient provided with [facility] generic drug list for further fills of b/p medications. Patient provided with discount drug card to assist with cost. Patient to follow up with appt at [facility] made by weekday CM. Patient friend at bedside to provide tx home.   Leonie Green [phone]

## 2012-04-05 NOTE — Discharge Summary (Addendum)
Physician Discharge Summary  Brendan Lee YQM:[id number] DOB: [birth year] DOA: 04/01/2012  PCP: [facility]  Admit date: 04/01/2012 Discharge date: 04/05/2012  Recommendations for Outpatient Follow-up:  1. Please follow up on blood pressure and creatinine given recent addition of lisinopril.  Adjust medication as needed. Consider further work-up given resistant hypertension ex sleep study, hyperaldosteronism  Discharge Diagnoses:  Principal Problem:  *Heart failure, diastolic, due to HTN Active Problems:  CHF (congestive heart failure)  Medically noncompliant  Obesity  Hypertensive emergency   1. Heart Failure - f/u with CE x 3 negative  -Echo results reviewed and patient with mild LVH  - lisinopril added recently as well as B blocker and isosorbide mononitrate. Last creatinine WNL's  - Given that patient will have difficulty with fund for blood pressure medication have discussed with care manager and will fill out application for Heart failure inpatient medication assistance program. - Will discontinue amlodipine on discharge and recommend isosorbide mononitrate 30 mg q day.   2. HTN  - Likely has been uncontrolled for years and presumed to be leading up to number 1  - Will change medication on discharge to medications provided based on inpatient heart failure medication assistance  - D/C on hctz, lisinopril, metoprolol, isosorbide mononitrate. Addendum: Will provide script for amlodipine and hydrochlorothiazide which patient is to take should his blood pressure remain elevated above goal 140/90 despite taking medications provided by Heart failure inpatient medication assistance program. - Would recommended low salt diet < 2 grams per day.  3 Obesity  - noted   Discharge Condition: stable  Diet recommendation: Low salt diet < 2 grams per day  History of present illness:  From original HPI: Brendan Lee is an 51 y.o. male with history of  hypertension, diet-controlled diabetes, hypercholesterolemia, severe noncompliant due to cost constrained, has not seen a physician in quite a while, presents to [facility] long emergency room complaining of dyspnea and exertion but no chest pain, no pleuritic pain, no cough, fever or chills. He admitted to having some orthopnea as well along with slight pedal edema. In emergency room he was found to be severely hypertensive with systolic blood pressure of 190 and diastolic pressure at time 140. He has P pulmonale in his EKG, in sinus rhythm with no acute ST-T changes. His further workup included serum sodium of 133, creatinine of 1.22, white count of 11,000 and hemoglobin of 15.4 g per decaliter. His BNP is elevated to 3.3 thousand, and his chest x-ray shows evidence of mild congestive heart failure. His initial cardiac markers were negative. Hospitalist was asked to admit him because of malignant hypertension with likely diastolic congestive heart failure.   Hospital Course:  Patient was placed on oral antihypertensive medication.  He had three sets of cardiac enzymes which were negative and Echo showed mild LVH.  Patient on discharge had improved blood pressures and did not complaint of any chest discomfort.  Have obtained assistance for him to obtain blood pressure medication as indicated above.  Procedures:  Echocardiogram impression: Study Conclusions  - Left ventricle: Poor acoustic windows limit study. OVerall LVEF appears mild to moderately depressed. Endocardium is not well seen. The cavity size was normal. Wall thickness was increased in a pattern of mild LVH. - Left atrium: The atrium was moderately dilated. - Pericardium, extracardiac: A trivial pericardial effusion was identified. Transthoracic echocardiography. M-mode, complete 2D, spectral Doppler, and color Doppler. Height: Height: 186.7cm. Height: 73.5in. Weight: Weight: 133kg. Weight: 292.6lb. Body mass index: BMI: 38.2kg/m^2.  Body  surface area: BSA: 2.49m^2. Blood pressure: 160/106. Patient status: Inpatient. Location: Bedside.   Consultations:  Care manager  Discharge Exam: Filed Vitals:   04/05/12 0500  BP: 144/108  Pulse: 80  Temp:   Resp:    Filed Vitals:   04/05/12 0100 04/05/12 0300 04/05/12 0458 04/05/12 0500  BP: 175/108 176/100 168/112 144/108  Pulse: 84 80 80 80  Temp:   98.5 F (36.9 C)   TempSrc:   Oral   Resp:   20   Height:      Weight:   134.446 kg (296 lb 6.4 oz)   SpO2:   95%    General: Pt in NAD, A and O x 3 Cardiovascular: RRR, No murmurs rubs gallops Respiratory: Clear to auscultation, no wheezes  Discharge Instructions  Discharge Orders    Future Orders Please Complete By Expires   Diet - low sodium heart healthy      Increase activity slowly      Discharge instructions      Comments:   Please follow up with a primary care physician as per our discussion and discussion with care manager in 1-2 weeks or sooner should any new concerns arise.  Please call the number below to confirm your appointment.  [facility]   [phone]  [address] [place], [place], [place] [address]    Call MD for:  persistant nausea and vomiting      Call MD for:  severe uncontrolled pain      Call MD for:  persistant dizziness or light-headedness        Medication List  As of 04/05/2012 11:30 AM   TAKE these medications         hydrochlorothiazide 25 MG tablet   Commonly known as: HYDRODIURIL   Take 1 tablet (25 mg total) by mouth daily.      isosorbide mononitrate 30 MG 24 hr tablet   Commonly known as: IMDUR   Take 1 tablet (30 mg total) by mouth daily.      lisinopril 20 MG tablet   Commonly known as: PRINIVIL,ZESTRIL   Take 1 tablet (20 mg total) by mouth daily.      metoprolol 200 MG 24 hr tablet   Commonly known as: TOPROL-XL   Take 1 tablet (200 mg total) by mouth daily. Take with or immediately following a meal.  Amlodipine 5 mg  po q day           Follow-up Information    [facility]2012. (at 2 pm with Dr Leticia Clas)    Contact information:   [facility] [phone]; Please see Duane Boston for the financial assistance program and bring in 2 months of pay stubs along with your ID.          The results of significant diagnostics from this hospitalization (including imaging, microbiology, ancillary and laboratory) are listed below for reference.    Significant Diagnostic Studies: Dg Chest 2 View  04/01/2012  *RADIOLOGY REPORT*  Clinical Data: Short of breath  CHEST - 2 VIEW  Comparison: 04/19/2011  Findings: Mild cardiomegaly.  Vascular congestion.  Kerley B lines at both lung bases compatible with mild interstitial edema.  Small pleural effusions.  IMPRESSION: Mild CHF.  Original Report Authenticated By: Donavan Burnet, M.D.    Microbiology: No results found for this or any previous visit (from the past 240 hour(s)).   Labs: Basic Metabolic  Panel:  Lab 04/05/12 0423 04/02/12 1040 04/02/12 0323 04/01/12 2010  NA 136 -- 136 133*  K 4.0 -- 3.9 4.2  CL 102 -- 98 100  CO2 25 -- 28 22  GLUCOSE 107* -- 114* 99  BUN 19 -- 18 18  CREATININE 1.32 1.17 1.42* 1.22  CALCIUM 9.6 -- 9.7 9.3  MG -- -- -- --  PHOS -- -- -- --   Liver Function Tests: No results found for this basename: AST:5,ALT:5,ALKPHOS:5,BILITOT:5,PROT:5,ALBUMIN:5 in the last 168 hours No results found for this basename: LIPASE:5,AMYLASE:5 in the last 168 hours No results found for this basename: AMMONIA:5 in the last 168 hours CBC:  Lab 04/02/12 0323 04/01/12 2010  WBC 10.2 11.1*  NEUTROABS -- 7.4  HGB 15.6 15.4  HCT 45.7 44.9  MCV 94.4 93.9  PLT 181 167   Cardiac Enzymes:  Lab 04/02/12 1843 04/02/12 1040 04/02/12 0323  CKTOTAL 111 112 124  CKMB 2.3 2.3 2.3  CKMBINDEX -- -- --  TROPONINI <0.30 <0.30 <0.30   BNP: BNP (last 3 results)  Basename 04/01/12 2210 04/19/11 1337  PROBNP 3346.0*  2064.0*   CBG: No results found for this basename: GLUCAP:5 in the last 168 hours  Time coordinating discharge: > 35 minutes  Signed:  Penny Pia  [facility] 04/05/2012, 11:30 AM

## 2015-05-15 ENCOUNTER — Emergency Department (HOSPITAL_COMMUNITY): Payer: Self-pay

## 2015-05-15 ENCOUNTER — Emergency Department (HOSPITAL_COMMUNITY)
Admission: EM | Admit: 2015-05-15 | Discharge: 2015-05-15 | Disposition: A | Discharge status: Home / Self Care | Payer: Self-pay | Attending: Emergency Medicine | Admitting: Emergency Medicine

## 2015-05-15 DIAGNOSIS — Z7982 Long term (current) use of aspirin: Secondary | ICD-10-CM | POA: Insufficient documentation

## 2015-05-15 DIAGNOSIS — R0789 Other chest pain: Secondary | ICD-10-CM | POA: Insufficient documentation

## 2015-05-15 DIAGNOSIS — Z79899 Other long term (current) drug therapy: Secondary | ICD-10-CM | POA: Insufficient documentation

## 2015-05-15 DIAGNOSIS — E119 Type 2 diabetes mellitus without complications: Secondary | ICD-10-CM | POA: Insufficient documentation

## 2015-05-15 DIAGNOSIS — R200 Anesthesia of skin: Secondary | ICD-10-CM | POA: Insufficient documentation

## 2015-05-15 DIAGNOSIS — Z720 Tobacco use: Secondary | ICD-10-CM | POA: Insufficient documentation

## 2015-05-15 DIAGNOSIS — I10 Essential (primary) hypertension: Secondary | ICD-10-CM | POA: Insufficient documentation

## 2015-05-15 LAB — CBC
HCT: 49 % (ref 39.0–52.0)
Hemoglobin: 17.1 g/dL — ABNORMAL HIGH (ref 13.0–17.0)
MCH: 32.8 pg (ref 26.0–34.0)
MCHC: 34.9 g/dL (ref 30.0–36.0)
MCV: 93.9 fL (ref 78.0–100.0)
PLATELETS: 202 10*3/uL (ref 150–400)
RBC: 5.22 MIL/uL (ref 4.22–5.81)
RDW: 13.9 % (ref 11.5–15.5)
WBC: 10.9 10*3/uL — AB (ref 4.0–10.5)

## 2015-05-15 LAB — I-STAT TROPONIN, ED
TROPONIN I, POC: 0.02 ng/mL (ref 0.00–0.08)
TROPONIN I, POC: 0.01 ng/mL (ref 0.00–0.08)

## 2015-05-15 LAB — BASIC METABOLIC PANEL
Anion gap: 12 (ref 5–15)
BUN: 18 mg/dL (ref 6–20)
CO2: 27 mmol/L (ref 22–32)
Calcium: 9.2 mg/dL (ref 8.9–10.3)
Chloride: 97 mmol/L — ABNORMAL LOW (ref 101–111)
Creatinine, Ser: 1.14 mg/dL (ref 0.61–1.24)
GFR calc Af Amer: >60 mL/min (ref >60)
GFR calc non Af Amer: >60 mL/min (ref >60)
Glucose, Bld: 112 mg/dL — ABNORMAL HIGH (ref 65–99)
Potassium: 3.4 mmol/L — ABNORMAL LOW (ref 3.5–5.1)
Sodium: 136 mmol/L (ref 135–145)

## 2015-05-15 LAB — CBG MONITORING, ED: Glucose-Capillary: 115 mg/dL — ABNORMAL HIGH (ref 65–99)

## 2015-05-15 MED ORDER — ASPIRIN 81 MG PO CHEW
81.0000 mg | CHEWABLE_TABLET | Freq: Once | ORAL | Status: AC
  Administered 2015-05-15: 81 mg via ORAL
  Filled 2015-05-15: qty 1

## 2015-05-15 MED ORDER — METOPROLOL SUCCINATE ER 200 MG PO TB24: 200.0000 mg | ORAL_TABLET | Freq: Every day | ORAL | Status: AC

## 2015-05-15 MED ORDER — METOPROLOL SUCCINATE ER 100 MG PO TB24: 200.0000 mg | ORAL_TABLET | Freq: Every day | ORAL | Status: DC

## 2015-05-15 MED ORDER — ISOSORBIDE MONONITRATE ER 30 MG PO TB24: 30.0000 mg | ORAL_TABLET | Freq: Every day | ORAL | Status: DC

## 2015-05-15 MED ORDER — ISOSORBIDE MONONITRATE ER 30 MG PO TB24: 30.0000 mg | ORAL_TABLET | Freq: Every day | ORAL | Status: AC

## 2015-05-15 MED ORDER — LISINOPRIL 20 MG PO TABS: 20.0000 mg | ORAL_TABLET | Freq: Every day | ORAL | Status: AC

## 2015-05-15 MED ORDER — HYDROCHLOROTHIAZIDE 25 MG PO TABS: 25.0000 mg | ORAL_TABLET | Freq: Every day | ORAL | Status: AC

## 2015-05-15 MED ORDER — POTASSIUM CHLORIDE CRYS ER 20 MEQ PO TBCR
40.0000 meq | EXTENDED_RELEASE_TABLET | Freq: Once | ORAL | Status: AC
  Administered 2015-05-15: 40 meq via ORAL
  Filled 2015-05-15: qty 2

## 2015-05-15 MED ORDER — AMLODIPINE BESYLATE 5 MG PO TABS: 5.0000 mg | ORAL_TABLET | Freq: Every day | ORAL | Status: AC

## 2015-05-15 NOTE — ED Notes (Addendum)
Pt reports he woke up with SOB and hand tingling. Denies pain. Hx of HTN, has not taken BP meds in 2 months.  Pt reports intermittent chest pain yesterday.

## 2015-05-15 NOTE — Discharge Instructions (Signed)
Chest Pain (Nonspecific) You can see the [facility] and community wellness [facility] on Friday, 05/19/2015. Show up at 8 AM as a walk in. Take your medications as prescribed. Take 1 baby aspirin (81 milligrams) daily. Ask your new physician at the [facility] community wellness [facility] to help you to stop smoking. Ask them to recheck your blood pressure when you go to your appointment this week. Today's was elevated at 176/114. It is often hard to give a diagnosis for the cause of chest pain. There is always a chance that your pain could be related to something serious, such as a heart attack or a blood clot in the lungs. You need to follow up with your doctor. HOME CARE  If antibiotic medicine was given, take it as directed by your doctor. Finish the medicine even if you start to feel better.  For the next few days, avoid activities that bring on chest pain. Continue physical activities as told by your doctor.  Do not use any tobacco products. This includes cigarettes, chewing tobacco, and e-cigarettes.  Avoid drinking alcohol.  Only take medicine as told by your doctor.  Follow your doctor's suggestions for more testing if your chest pain does not go away.  Keep all doctor visits you made. GET HELP IF:  Your chest pain does not go away, even after treatment.  You have a rash with blisters on your chest.  You have a fever. GET HELP RIGHT AWAY IF:   You have more pain or pain that spreads to your arm, neck, jaw, back, or belly (abdomen).  You have shortness of breath.  You cough more than usual or cough up blood.  You have very bad back or belly pain.  You feel sick to your stomach (nauseous) or throw up (vomit).  You have very bad weakness.  You pass out (faint).  You have chills. This is an emergency. Do not wait to see if the problems will go away. Call your local emergency services (911 in [place] Do not drive yourself to the hospital. MAKE SURE YOU:   Understand  these instructions.  Will watch your condition.  Will get help right away if you are not doing well or get worse. Document Released: 02/26/2008 Document Revised: 09/14/2013 Document Reviewed: 02/26/2008 [facility] Patient Information 2015 [place], [place]. This information is not intended to replace advice given to you by your health care provider. Make sure you discuss any questions you have with your health care provider.

## 2015-05-15 NOTE — ED Provider Notes (Signed)
CSN: [id number]     Arrival date & time 05/15/15  1704 History   First MD Initiated Contact with Patient 05/15/15 2145     Chief Complaint  Patient presents with  . Shortness of Breath  . hand numbness      (Consider location/radiation/quality/duration/timing/severity/associated sxs/prior Treatment) HPI Patient had sensation that he couldn't get a deep breath today for 1 hour. Although denied shortness of breath and denied chest pain he also had sensation of tingling of all fingers in both hands at the time. He is presently asymptomatic without treatment. He's been noncompliant with all of his medications for several months. He denies chest pain he was sweaty for a few minutes this afternoon. Symptoms nonexertional. No other associated symptoms. No treatment prior to coming here Past Medical History  Diagnosis Date  . Hypertension   . Diabetes mellitus   . High cholesterol    congestive heart failure No past surgical history on file. No family history on file. Social History  Substance Use Topics  . Smoking status: Current Every Day Smoker -- 0.50 packs/day for 17 years    Types: Cigarettes  . Smokeless tobacco: Never Used     Comment: PATIENT ADVISED ON QUIT SMOKING  . Alcohol Use: Not on file    Review of Systems  Constitutional: Negative.   HENT: Negative.   Respiratory: Negative.   Cardiovascular: Positive for chest pain.       Vague chest discomfort, "I couldn't take a deep breath"  Gastrointestinal: Negative.   Musculoskeletal: Negative.   Skin: Negative.   Neurological: Negative.   Psychiatric/Behavioral: Negative.   All other systems reviewed and are negative.     Allergies  Review of patient's allergies indicates no known allergies.  Home Medications   Prior to Admission medications   Medication Sig Start Date End Date Taking? Authorizing Provider  amLODipine (NORVASC) 5 MG tablet Take 5 mg by mouth daily.   Yes Historical Provider, MD  aspirin 81 MG  tablet Take 81 mg by mouth daily.   Yes Historical Provider, MD  hydrochlorothiazide (HYDRODIURIL) 25 MG tablet Take 25 mg by mouth daily.   Yes Historical Provider, MD  isosorbide mononitrate (IMDUR) 30 MG 24 hr tablet Take 30 mg by mouth daily.   Yes Historical Provider, MD  lisinopril (PRINIVIL,ZESTRIL) 20 MG tablet Take 20 mg by mouth daily.   Yes Historical Provider, MD  metoprolol (TOPROL-XL) 200 MG 24 hr tablet Take 200 mg by mouth daily.   Yes Historical Provider, MD  amLODipine (NORVASC) 5 MG tablet Take 1 tablet (5 mg total) by mouth daily. 04/05/12 04/05/13  Penny Pia, MD  hydrochlorothiazide (HYDRODIURIL) 25 MG tablet Take 1 tablet (25 mg total) by mouth daily. 04/05/12 04/05/13  Penny Pia, MD  isosorbide mononitrate (IMDUR) 30 MG 24 hr tablet Take 1 tablet (30 mg total) by mouth daily. 04/05/12 04/05/13  Penny Pia, MD  lisinopril (PRINIVIL,ZESTRIL) 20 MG tablet Take 1 tablet (20 mg total) by mouth daily. 04/05/12 04/05/13  Penny Pia, MD  metoprolol succinate (TOPROL-XL) 200 MG 24 hr tablet Take 1 tablet (200 mg total) by mouth daily. Take with or immediately following a meal. 04/05/12 04/05/13  Penny Pia, MD   BP 177/129 mmHg  Pulse 112  Temp(Src) 98.3 F (36.8 C) (Oral)  Resp 16  SpO2 93% Physical Exam  Constitutional: He appears well-developed and well-nourished.  HENT:  Head: Normocephalic and atraumatic.  Eyes: Conjunctivae are normal. Pupils are equal, round, and reactive to light.  Neck: Neck supple. No tracheal deviation present. No thyromegaly present.  Cardiovascular: Normal rate and regular rhythm.   No murmur heard. Heart rate counted is 100 by me  Pulmonary/Chest: Effort normal and breath sounds normal.  Abdominal: Soft. Bowel sounds are normal. He exhibits no distension. There is no tenderness.  Musculoskeletal: Normal range of motion. He exhibits no edema or tenderness.  Neurological: He is alert. Coordination normal.  Skin: Skin is warm and dry. No rash  noted.  Psychiatric: He has a normal mood and affect.  Nursing note and vitals reviewed.   ED Course  Procedures (including critical care time) Labs Review Labs Reviewed  BASIC METABOLIC PANEL - Abnormal; Notable for the following:    Potassium 3.4 (*)    Chloride 97 (*)    Glucose, Bld 112 (*)    All other components within normal limits  CBC - Abnormal; Notable for the following:    WBC 10.9 (*)    Hemoglobin 17.1 (*)    All other components within normal limits  CBG MONITORING, ED - Abnormal; Notable for the following:    Glucose-Capillary 115 (*)    All other components within normal limits  I-STAT TROPOININ, ED    Imaging Review Dg Chest 2 View  05/15/2015   CLINICAL DATA:  Shortness of breath and productive cough since yesterday, history hypertension, diabetes mellitus, CHF, smoking  EXAM: CHEST  2 VIEW  COMPARISON:  04/01/2012  FINDINGS: Normal heart size, mediastinal contours, and pulmonary vascularity.  Lungs clear.  No pneumothorax.  Bones unremarkable.  IMPRESSION: No active cardiopulmonary disease.   Electronically Signed   By: Ulyses Southward M.D.   On: 05/15/2015 18:02   chest x-ray viewed by me I have personally reviewed and evaluated these images and lab results as part of my medical decision-making.   EKG Interpretation   Date/Time:  Monday May 15 2015 17:26:21 EDT Ventricular Rate:  97 PR Interval:  161 QRS Duration: 94 QT Interval:  362 QTC Calculation: 460 R Axis:   -9 Text Interpretation:  Sinus rhythm Abnormal R-wave progression, early  transition Probable left ventricular hypertrophy Baseline wander in  lead(s) I II aVR No significant change since last tracing Confirmed by  Ethelda Chick  MD, Isbella Arline [phone]) on 05/15/2015 9:53:08 PM     11 PM patient remains asymptomatic Chest x-ray viewed by me Results for orders placed or performed during the hospital encounter of 05/15/15  Basic metabolic panel  Result Value Ref Range   Sodium 136 135 - 145 mmol/L    Potassium 3.4 (L) 3.5 - 5.1 mmol/L   Chloride 97 (L) 101 - 111 mmol/L   CO2 27 22 - 32 mmol/L   Glucose, Bld 112 (H) 65 - 99 mg/dL   BUN 18 6 - 20 mg/dL   Creatinine, Ser 1.91 0.61 - 1.24 mg/dL   Calcium 9.2 8.9 - 47.8 mg/dL   GFR calc non Af Amer >60 >60 mL/min   GFR calc Af Amer >60 >60 mL/min   Anion gap 12 5 - 15  CBC  Result Value Ref Range   WBC 10.9 (H) 4.0 - 10.5 K/uL   RBC 5.22 4.22 - 5.81 MIL/uL   Hemoglobin 17.1 (H) 13.0 - 17.0 g/dL   HCT 29.5 62.1 - 30.8 %   MCV 93.9 78.0 - 100.0 fL   MCH 32.8 26.0 - 34.0 pg   MCHC 34.9 30.0 - 36.0 g/dL   RDW 65.7 84.6 - 96.2 %   Platelets 202 150 -  400 K/uL  CBG monitoring, ED  Result Value Ref Range   Glucose-Capillary 115 (H) 65 - 99 mg/dL  I-stat troponin, ED  Result Value Ref Range   Troponin i, poc 0.02 0.00 - 0.08 ng/mL   Comment 3          I-stat troponin, ED  Result Value Ref Range   Troponin i, poc 0.01 0.00 - 0.08 ng/mL   Comment 3           Dg Chest 2 View  05/15/2015   CLINICAL DATA:  Shortness of breath and productive cough since yesterday, history hypertension, diabetes mellitus, CHF, smoking  EXAM: CHEST  2 VIEW  COMPARISON:  04/01/2012  FINDINGS: Normal heart size, mediastinal contours, and pulmonary vascularity.  Lungs clear.  No pneumothorax.  Bones unremarkable.  IMPRESSION: No active cardiopulmonary disease.   Electronically Signed   By: Ulyses Southward M.D.   On: 05/15/2015 18:02    MDM  Heart score was 3. Patient does not quire hospitalization. He does quire close follow-up with PMD and to be restarted on his medications. I had 5 minute discussion with patient counseling him on smoking cessation. Case manager was consulted. Patient can follow-up with [facility] in 4 days Final diagnoses:  None   Diagnosis #1 atypical chest pain #2 medication noncompliance #3 hypertension #4tobacco abuse #5 hypokalemia    Doug Sou, MD 05/15/15 2306

## 2015-05-15 NOTE — Progress Notes (Signed)
[facility] consulted by EDP to speak to patient regarding follow up and medications.  Patient has not taken his medications in two months presenting to ED with elevated blood pressure.  Patient also without pcp.  [facility] provided patient with pamphlet to [facility] explained services.  EDCM instructed patient to go to [facility] on Friday August 26th for walk in clinic.  Discussed patient with EDP who will provided patient with prescriptions for home medications.  EDCM placed patient in [facility] program.  Instructed patient MATCH letter expires in seven days and is a one time a year assist. Patient reports he will get his medications this evening or tomorrow.  Patient thankful for services.  No further EDCm needs at this time.

## 2015-05-16 NOTE — Progress Notes (Signed)
[facility] called patient for follow up.  Patient reports he was able to purchase his medications using MATCH letter without difficulty and plans on going to [facility] on Friday.  No further EDCM needs at this time.

## 2015-05-26 ENCOUNTER — Ambulatory Visit: Payer: Self-pay

## 2015-11-10 ENCOUNTER — Encounter: Payer: Self-pay | Admitting: Gastroenterology

## 2016-01-09 ENCOUNTER — Encounter: Payer: Self-pay | Admitting: Gastroenterology

## 2016-02-22 ENCOUNTER — Encounter: Payer: Self-pay | Admitting: *Deleted

## 2016-02-23 ENCOUNTER — Encounter: Payer: Self-pay | Admitting: Family Medicine

## 2016-02-27 ENCOUNTER — Encounter: Payer: Self-pay | Admitting: Gastroenterology

## 2016-04-16 ENCOUNTER — Encounter: Payer: Self-pay | Admitting: Gastroenterology

## 2016-07-04 ENCOUNTER — Encounter: Payer: Self-pay | Admitting: Gastroenterology

## 2016-07-26 ENCOUNTER — Encounter: Payer: Self-pay | Admitting: Family Medicine

## 2016-10-02 ENCOUNTER — Encounter: Payer: Self-pay | Admitting: Gastroenterology

## 2016-10-22 ENCOUNTER — Ambulatory Visit (AMBULATORY_SURGERY_CENTER): Payer: Self-pay | Admitting: *Deleted

## 2016-10-22 VITALS — Ht 73.0 in | Wt 265.8 lb

## 2016-10-22 DIAGNOSIS — Z1211 Encounter for screening for malignant neoplasm of colon: Secondary | ICD-10-CM

## 2016-10-22 MED ORDER — NA SULFATE-K SULFATE-MG SULF 17.5-3.13-1.6 GM/177ML PO SOLN: ORAL | 0 refills | Status: DC

## 2016-10-22 NOTE — Progress Notes (Signed)
No egg or soy allergy  No anesthesia or intubation problems per pt  No diet medications taken  No home oxygen used or hx of sleep apnea 

## 2016-10-28 ENCOUNTER — Telehealth: Payer: Self-pay | Admitting: Gastroenterology

## 2016-10-28 NOTE — Telephone Encounter (Signed)
Pt aware he has been placed on the wait list for a sample prep.

## 2016-11-01 ENCOUNTER — Ambulatory Visit (AMBULATORY_SURGERY_CENTER): Payer: Self-pay | Admitting: Gastroenterology

## 2016-11-01 ENCOUNTER — Encounter: Payer: Self-pay | Admitting: Gastroenterology

## 2016-11-01 VITALS — BP 120/87 | HR 86 | Temp 96.6°F | Resp 17 | Ht 73.0 in | Wt 265.0 lb

## 2016-11-01 DIAGNOSIS — Z1211 Encounter for screening for malignant neoplasm of colon: Secondary | ICD-10-CM

## 2016-11-01 DIAGNOSIS — Z1212 Encounter for screening for malignant neoplasm of rectum: Secondary | ICD-10-CM

## 2016-11-01 MED ORDER — SODIUM CHLORIDE 0.9 % IV SOLN: 500.0000 mL | INTRAVENOUS | Status: AC

## 2016-11-01 NOTE — Progress Notes (Signed)
To PACU, vss patent aw report to rn 

## 2016-11-01 NOTE — Op Note (Signed)
[facility] Patient Name: Brendan Lee Procedure Date: 11/01/2016 9:09 AM MRN: [id number] Endoscopist: Sherilyn Cooter L. Myrtie Neither , MD Age: 56 Referring MD:  Date of Birth: 21-Oct-1960 Gender: Male Account #: [id number] Procedure:                Colonoscopy Indications:              Screening for colorectal malignant neoplasm, This                            is the patient's first colonoscopy Medicines:                Monitored Anesthesia Care Procedure:                Pre-Anesthesia Assessment:                           - Prior to the procedure, a History and Physical                            was performed, and patient medications and                            allergies were reviewed. The patient's tolerance of                            previous anesthesia was also reviewed. The risks                            and benefits of the procedure and the sedation                            options and risks were discussed with the patient.                            All questions were answered, and informed consent                            was obtained. Anticoagulants: The patient has taken                            aspirin. It was decided not to withhold this                            medication prior to the procedure. ASA Grade                            Assessment: III - A patient with severe systemic                            disease. After reviewing the risks and benefits,                            the patient was deemed in satisfactory condition to  undergo the procedure.                           After obtaining informed consent, the colonoscope                            was passed under direct vision. Throughout the                            procedure, the patient's blood pressure, pulse, and                            oxygen saturations were monitored continuously. The                            Model CF-HQ190L [id number]) scope was  introduced                            through the anus and advanced to the the cecum,                            identified by appendiceal orifice and ileocecal                            valve. The colonoscopy was performed without                            difficulty. The patient tolerated the procedure                            well. The quality of the bowel preparation was                            excellent. The ileocecal valve, appendiceal                            orifice, and rectum were photographed. The quality                            of the bowel preparation was evaluated using the                            BBPS [facility] Bowel Preparation Scale) with scores                            of: Right Colon = 3, Transverse Colon = 3 and Left                            Colon = 3 (entire mucosa seen well with no residual                            staining, small fragments of stool or opaque  liquid). The total BBPS score equals 9. The bowel                            preparation used was SUPREP. Scope In: 9:25:47 AM Scope Out: 9:35:34 AM Scope Withdrawal Time: 0 hours 7 minutes 42 seconds  Total Procedure Duration: 0 hours 9 minutes 47 seconds  Findings:                 The digital rectal exam findings include decreased                            sphincter tone.                           Multiple small-mouthed diverticula were found in                            the left colon.                           Internal hemorrhoids were found during retroflexion                            and during anoscopy. The hemorrhoids were small and                            Grade I (internal hemorrhoids that do not prolapse).                           The exam was otherwise without abnormality on                            direct and retroflexion views. Complications:            No immediate complications. Estimated Blood Loss:     Estimated blood loss:  none. Impression:               - Decreased sphincter tone found on digital rectal                            exam.                           - Diverticulosis in the left colon.                           - Internal hemorrhoids.                           - The examination was otherwise normal on direct                            and retroflexion views.                           - No specimens collected. Recommendation:           - Patient has a contact number available  for                            emergencies. The signs and symptoms of potential                            delayed complications were discussed with the                            patient. Return to normal activities tomorrow.                            Written discharge instructions were provided to the                            patient.                           - Resume previous diet.                           - Continue present medications.                           - Repeat colonoscopy in 10 years for screening                            purposes. Henry L. Myrtie Neither, MD 11/01/2016 9:38:45 AM This report has been signed electronically.

## 2016-11-01 NOTE — Patient Instructions (Signed)
YOU HAD AN ENDOSCOPIC PROCEDURE TODAY AT THE [facility]:   Refer to the procedure report that was given to you for any specific questions about what was found during the examination.  If the procedure report does not answer your questions, please call your gastroenterologist to clarify.  If you requested that your care partner not be given the details of your procedure findings, then the procedure report has been included in a sealed envelope for you to review at your convenience later.  YOU SHOULD EXPECT: Some feelings of bloating in the abdomen. Passage of more gas than usual.  Walking can help get rid of the air that was put into your GI tract during the procedure and reduce the bloating. If you had a lower endoscopy (such as a colonoscopy or flexible sigmoidoscopy) you may notice spotting of blood in your stool or on the toilet paper. If you underwent a bowel prep for your procedure, you may not have a normal bowel movement for a few days.  Please Note:  You might notice some irritation and congestion in your nose or some drainage.  This is from the oxygen used during your procedure.  There is no need for concern and it should clear up in a day or so.  SYMPTOMS TO REPORT IMMEDIATELY:   Following lower endoscopy (colonoscopy or flexible sigmoidoscopy):  Excessive amounts of blood in the stool  Significant tenderness or worsening of abdominal pains  Swelling of the abdomen that is new, acute  Fever of 100F or higher   For urgent or emergent issues, a gastroenterologist can be reached at any hour by calling [phone].   DIET:  We do recommend a small meal at first, but then you may proceed to your regular diet.  Drink plenty of fluids but you should avoid alcoholic beverages for 24 hours.  ACTIVITY:  You should plan to take it easy for the rest of today and you should NOT DRIVE or use heavy machinery until tomorrow (because of the sedation medicines used during the test).     FOLLOW UP: Our staff will call the number listed on your records the next business day following your procedure to check on you and address any questions or concerns that you may have regarding the information given to you following your procedure. If we do not reach you, we will leave a message.  However, if you are feeling well and you are not experiencing any problems, there is no need to return our call.  We will assume that you have returned to your regular daily activities without incident.  If any biopsies were taken you will be contacted by phone or by letter within the next 1-3 weeks.  Please call us at [phone] if you have not heard about the biopsies in 3 weeks.    SIGNATURES/CONFIDENTIALITY: You and/or your care partner have signed paperwork which will be entered into your electronic medical record.  These signatures attest to the fact that that the information above on your After Visit Summary has been reviewed and is understood.  Full responsibility of the confidentiality of this discharge information lies with you and/or your care-partner.  Diverticulosis, high fiber diet, hemorrhoid information given.  Recall 10 years 2028.

## 2016-11-04 ENCOUNTER — Telehealth: Payer: Self-pay | Admitting: *Deleted

## 2016-11-04 NOTE — Telephone Encounter (Signed)
  Follow up Call-  Call back number 11/01/2016  Post procedure Call Back phone  # [phone]  Permission to leave phone message Yes  Some recent data might be hidden     Patient questions:  Do you have a fever, pain , or abdominal swelling? No. Pain Score  0 *  Have you tolerated food without any problems? Yes.    Have you been able to return to your normal activities? Yes.    Do you have any questions about your discharge instructions: Diet   No. Medications  No. Follow up visit  No.  Do you have questions or concerns about your Care? No.  Actions: * If pain score is 4 or above: No action needed, pain <4.

## 2018-01-11 ENCOUNTER — Other Ambulatory Visit: Payer: Self-pay

## 2018-01-11 ENCOUNTER — Encounter (HOSPITAL_BASED_OUTPATIENT_CLINIC_OR_DEPARTMENT_OTHER): Payer: Self-pay | Admitting: Emergency Medicine

## 2018-01-11 ENCOUNTER — Emergency Department (HOSPITAL_BASED_OUTPATIENT_CLINIC_OR_DEPARTMENT_OTHER)
Admission: EM | Admit: 2018-01-11 | Discharge: 2018-01-11 | Disposition: A | Discharge status: Home / Self Care | Payer: Self-pay | Attending: Emergency Medicine | Admitting: Emergency Medicine

## 2018-01-11 ENCOUNTER — Emergency Department (HOSPITAL_BASED_OUTPATIENT_CLINIC_OR_DEPARTMENT_OTHER): Payer: Self-pay

## 2018-01-11 DIAGNOSIS — I110 Hypertensive heart disease with heart failure: Secondary | ICD-10-CM | POA: Insufficient documentation

## 2018-01-11 DIAGNOSIS — E119 Type 2 diabetes mellitus without complications: Secondary | ICD-10-CM | POA: Insufficient documentation

## 2018-01-11 DIAGNOSIS — L723 Sebaceous cyst: Secondary | ICD-10-CM | POA: Insufficient documentation

## 2018-01-11 DIAGNOSIS — Z7982 Long term (current) use of aspirin: Secondary | ICD-10-CM | POA: Insufficient documentation

## 2018-01-11 DIAGNOSIS — M25511 Pain in right shoulder: Secondary | ICD-10-CM | POA: Insufficient documentation

## 2018-01-11 DIAGNOSIS — M25512 Pain in left shoulder: Secondary | ICD-10-CM | POA: Insufficient documentation

## 2018-01-11 DIAGNOSIS — L089 Local infection of the skin and subcutaneous tissue, unspecified: Secondary | ICD-10-CM

## 2018-01-11 DIAGNOSIS — F17210 Nicotine dependence, cigarettes, uncomplicated: Secondary | ICD-10-CM | POA: Insufficient documentation

## 2018-01-11 DIAGNOSIS — M542 Cervicalgia: Secondary | ICD-10-CM

## 2018-01-11 DIAGNOSIS — I509 Heart failure, unspecified: Secondary | ICD-10-CM | POA: Insufficient documentation

## 2018-01-11 DIAGNOSIS — G8929 Other chronic pain: Secondary | ICD-10-CM

## 2018-01-11 DIAGNOSIS — Z79899 Other long term (current) drug therapy: Secondary | ICD-10-CM | POA: Insufficient documentation

## 2018-01-11 LAB — CBG MONITORING, ED: Glucose-Capillary: 109 mg/dL — ABNORMAL HIGH (ref 65–99)

## 2018-01-11 MED ORDER — LIDOCAINE-EPINEPHRINE 2 %-1:100000 IJ SOLN
20.0000 mL | Freq: Once | INTRAMUSCULAR | Status: AC
  Administered 2018-01-11: 20 mL
  Filled 2018-01-11: qty 1

## 2018-01-11 NOTE — ED Triage Notes (Signed)
Patient states that he has had headache since Friday  - he has also has HTN since then. He saw his DR on Friday and had HTN and was told to monitor his BP

## 2018-01-11 NOTE — ED Notes (Addendum)
Pt reports pain in both shoulders and back of neck x 2-3 weeks. He reports pain radiates up back of his head to forehead on right side, constant x 3 days. Pt reports nausea with the head pain yesterday, not today. States he had been out of his BP meds x 6 months and just restarted them on Friday.

## 2018-01-11 NOTE — Discharge Instructions (Signed)
Please read and follow all provided instructions.  Your diagnoses today include:  1. Infected sebaceous cyst   2. Cervicalgia   3. Chronic pain of both shoulders     Tests performed today include:  Vital signs. See below for your results today.   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   Home care instructions:   Follow any educational materials contained in this packet  Follow-up instructions: Please follow-up with your primary care provider in the next 1 week for further evaluation of your symptoms.   Return instructions:  Return to the Emergency Department if you have:  Fever  Worsening symptoms  Worsening pain  Worsening swelling  Redness of the skin that moves away from the affected area, especially if it streaks away from the affected area   Any other emergent concerns  Your vital signs today were: BP (!) 153/101 (BP Location: Right Arm)    Pulse 70    Temp 98.6 F (37 C) (Oral)    Resp 20    Ht 6\' 1"  (1.854 m)    Wt 115.7 kg (255 lb)    SpO2 95%    BMI 33.64 kg/m  If your blood pressure (BP) was elevated above 135/85 this visit, please have this repeated by your doctor within one month. --------------

## 2018-01-11 NOTE — ED Provider Notes (Signed)
[facility] EMERGENCY DEPARTMENT Provider Note   CSN: [id number] Arrival date & time: 01/11/18  1628     History   Chief Complaint Chief Complaint  Patient presents with  . Headache    HPI Brendan Lee is a 57 y.o. male.  Patient presents with complaint of elevated blood pressure, continued shoulder pain and headache.  Patient has also noted an area of swelling to his occipital area that is draining some pus.  Patient was seen by his primary care physician 2 days ago and restarted on blood pressure medications.  He is currently taking these.  He also complains of pain in his shoulders with movement.  This is causing pain to shoot up his neck and go onto the top of his head.  No fevers, confusion.  Patient denies signs of stroke including: facial droop, slurred speech, aphasia, weakness/numbness in extremities, imbalance/trouble walking.       Past Medical History:  Diagnosis Date  . Arthritis   . Diabetes mellitus    diet controlled, no medications  . High cholesterol   . Hypertension     Patient Active Problem List   Diagnosis Date Noted  . Hypertension 04/05/2012  . Heart failure, diastolic, due to HTN (HCC) 04/02/2012  . CHF (congestive heart failure) (HCC) 04/02/2012  . Medically noncompliant 04/02/2012  . Obesity 04/02/2012    Past Surgical History:  Procedure Laterality Date  . HIP SURGERY     right  . KNEE ARTHROSCOPY     left knee        Home Medications    Prior to Admission medications   Medication Sig Start Date End Date Taking? Authorizing Provider  amLODipine (NORVASC) 5 MG tablet Take 5 mg by mouth daily.   Yes [provider]  aspirin 81 MG tablet Take 81 mg by mouth daily.   Yes [provider]  atorvastatin (LIPITOR) 40 MG tablet Take 40 mg by mouth daily. 09/28/15  Yes [provider]  hydrochlorothiazide (HYDRODIURIL) 25 MG tablet Take 25 mg by mouth daily.   Yes [provider]    isosorbide mononitrate (IMDUR) 30 MG 24 hr tablet Take 1 tablet (30 mg total) by mouth daily. 05/15/15 01/11/18 Yes Doug Sou, MD  LOSARTAN POTASSIUM PO Take by mouth.   Yes [provider]  metoprolol (TOPROL-XL) 200 MG 24 hr tablet Take 200 mg by mouth daily.   Yes [provider]  amLODipine (NORVASC) 5 MG tablet Take 1 tablet (5 mg total) by mouth daily. 05/15/15 05/14/16  Doug Sou, MD  hydrochlorothiazide (HYDRODIURIL) 25 MG tablet Take 1 tablet (25 mg total) by mouth daily. 05/15/15 05/14/16  Doug Sou, MD  isosorbide mononitrate (IMDUR) 30 MG 24 hr tablet Take 30 mg by mouth daily.    [provider]  lisinopril (PRINIVIL,ZESTRIL) 20 MG tablet Take 1 tablet (20 mg total) by mouth daily. 05/15/15 05/14/16  Doug Sou, MD  metoprolol (TOPROL-XL) 200 MG 24 hr tablet Take 1 tablet (200 mg total) by mouth daily. Take with or immediately following a meal. 05/15/15 05/14/16  Doug Sou, MD    Family History Family History  Problem Relation Age of Onset  . Stomach cancer Father   . Colon cancer Neg Hx   . Esophageal cancer Neg Hx   . Rectal cancer Neg Hx     Social History Social History   Tobacco Use  . Smoking status: Current Every Day Smoker    Packs/day: 0.50    Years:  17.00    Pack years: 8.50    Types: Cigarettes  . Smokeless tobacco: Never Used  . Tobacco comment: PATIENT ADVISED ON QUIT SMOKING  Substance Use Topics  . Alcohol use: Yes    Comment: weekends  . Drug use: No     Allergies   Patient has no known allergies.   Review of Systems Review of Systems  Constitutional: Negative for fever.  HENT: Negative for congestion, dental problem, rhinorrhea and sinus pressure.   Eyes: Negative for photophobia, discharge, redness and visual disturbance.  Respiratory: Negative for shortness of breath.   Cardiovascular: Negative for chest pain.  Gastrointestinal: Negative for nausea and vomiting.  Musculoskeletal: Positive  for arthralgias and myalgias. Negative for gait problem, neck pain and neck stiffness.  Skin: Negative for rash.       + abscess  Neurological: Positive for headaches. Negative for syncope, speech difficulty, weakness, light-headedness and numbness.  Psychiatric/Behavioral: Negative for confusion.     Physical Exam Updated Vital Signs BP (!) 143/110   Pulse 76   Temp 98.3 F (36.8 C) (Oral)   Resp 18   Ht 6\' 1"  (1.854 m)   Wt 115.7 kg (255 lb)   SpO2 97%   BMI 33.64 kg/m   Physical Exam  Constitutional: He is oriented to person, place, and time. He appears well-developed and well-nourished.  HENT:  Head: Normocephalic and atraumatic.  Right Ear: Tympanic membrane, external ear and ear canal normal.  Left Ear: Tympanic membrane, external ear and ear canal normal.  Nose: Nose normal.  Mouth/Throat: Uvula is midline, oropharynx is clear and moist and mucous membranes are normal.  2cm raised area on occipital area consistent with inflamed sebaceous cyst.   Eyes: Pupils are equal, round, and reactive to light. Conjunctivae, EOM and lids are normal.  Neck: Normal range of motion. Neck supple.  Cardiovascular: Normal rate and regular rhythm.  Pulmonary/Chest: Effort normal and breath sounds normal.  Abdominal: Soft. There is no tenderness.  Musculoskeletal: Normal range of motion.       Cervical back: He exhibits normal range of motion, no tenderness and no bony tenderness.  Neurological: He is alert and oriented to person, place, and time. He has normal strength and normal reflexes. No cranial nerve deficit or sensory deficit. He exhibits normal muscle tone. He displays a negative Romberg sign. Coordination and gait normal. GCS eye subscore is 4. GCS verbal subscore is 5. GCS motor subscore is 6.  Skin: Skin is warm and dry.  Psychiatric: He has a normal mood and affect.  Nursing note and vitals reviewed.    ED Treatments / Results  Labs (all labs ordered are listed, but only  abnormal results are displayed) Labs Reviewed  CBG MONITORING, ED - Abnormal; Notable for the following components:      Result Value   Glucose-Capillary 109 (*)    All other components within normal limits    EKG None  Radiology Dg Cervical Spine Complete  Result Date: 01/11/2018 CLINICAL DATA:  Pt with neck pain laterally radiating to bilateral shoulders R>L, NKI, headaches, dizziness this am but none now, pt walked over to radiology with swift and steady gait EXAM: CERVICAL SPINE - COMPLETE 4+ VIEW COMPARISON:  05/18/2009 FINDINGS: There is significant degenerative change involving the mid cervical levels. Disc height loss significantly noted at C3-4 and C4-5, C6-7. There is 2 millimeters of retrolisthesis of C3 on C4. Prevertebral soft tissues are normal in appearance. Lung apices are clear. IMPRESSION: 1. Mid  cervical degenerative changes have progressed since the prior study. 2. 2 millimeters retrolisthesis of C3 on C4, consistent with degenerative changes. 3.  No evidence for acute cervical spine abnormality. Electronically Signed   By: Norva PavlovElizabeth  Brown M.D.   On: 01/11/2018 18:29   Dg Shoulder Right  Result Date: 01/11/2018 CLINICAL DATA:  Neck pain laterally radiating to both shoulders, RIGHT greater than LEFT. No known injury. Headache and dizziness this morning. No known injury. EXAM: RIGHT SHOULDER - 2+ VIEW COMPARISON:  Chest x-ray 05/15/2015 FINDINGS: There is mild glenohumeral joint degenerative change. No acute fracture or subluxation. The RIGHT lung apex is unremarkable. IMPRESSION: 1. Mild degenerative changes. 2.  No evidence for acute  abnormality. Electronically Signed   By: Norva PavlovElizabeth  Brown M.D.   On: 01/11/2018 18:19   Dg Shoulder Left  Result Date: 01/11/2018 CLINICAL DATA:  Pt with neck pain laterally radiating to bilateral shoulders R>L, NKI, headaches, dizziness this am but none known. EXAM: LEFT SHOULDER - 2+ VIEW COMPARISON:  None. FINDINGS: There is mild  degenerative change at the glenohumeral joint. No acute fracture or subluxation. IMPRESSION: Mild degenerative changes. No evidence for acute  abnormality. Electronically Signed   By: Norva PavlovElizabeth  Brown M.D.   On: 01/11/2018 18:26    Procedures .Marland Kitchen.Incision and Drainage Date/Time: 01/11/2018 7:01 PM Performed by: Renne CriglerGeiple, Latrel Szymczak, PA-C Authorized by: Renne CriglerGeiple, Kharizma Lesnick, PA-C   Consent:    Consent obtained:  Verbal   Consent given by:  Patient   Risks discussed:  Bleeding, infection, pain and incomplete drainage   Alternatives discussed:  No treatment Location:    Type:  Cyst   Size:  2cm   Location:  Head   Head location:  Scalp Pre-procedure details:    Skin preparation:  Betadine Anesthesia (see MAR for exact dosages):    Anesthesia method:  Local infiltration   Local anesthetic:  Lidocaine 2% WITH epi Procedure details:    Incision types:  Stab incision   Incision depth:  Dermal   Scalpel blade:  11   Wound management:  Probed and deloculated   Drainage:  Purulent   Drainage amount:  Moderate   Wound treatment:  Wound left open   Packing materials:  None Post-procedure details:    Patient tolerance of procedure:  Tolerated well, no immediate complications .Nerve Block Date/Time: 01/11/2018 7:03 PM Performed by: Renne CriglerGeiple, Laycee Fitzsimmons, PA-C Authorized by: Renne CriglerGeiple, Ahuva Poynor, PA-C   Consent:    Consent obtained:  Verbal   Consent given by:  Patient   Risks discussed:  Infection, bleeding, pain and unsuccessful block   Alternatives discussed:  No treatment Indications:    Indications:  Pain relief Location:    Body area:  Head   Head nerve:  Greater occipital   Laterality:  Right Pre-procedure details:    Skin preparation:  Alcohol Procedure details (see MAR for exact dosages):    Block needle gauge:  22 G   Anesthetic injected:  Lidocaine 2% WITH epi   Steroid injected:  None   Additive injected:  None   Injection procedure:  Anatomic landmarks identified, anatomic landmarks  palpated and incremental injection Post-procedure details:    Dressing:  None   Outcome:  Pain relieved   Patient tolerance of procedure:  Tolerated well, no immediate complications   (including critical care time)  Medications Ordered in ED Medications  lidocaine-EPINEPHrine (XYLOCAINE W/EPI) 2 %-1:100000 (with pres) injection 20 mL (20 mLs Infiltration Given 01/11/18 1856)     Initial Impression / Assessment and Plan /  ED Course  I have reviewed the triage vital signs and the nursing notes.  Pertinent labs & imaging results that were available during my care of the patient were reviewed by me and considered in my medical decision making (see chart for details).     Patient seen and examined.  Patient with headache without neurological deficits.  It seems that his headache stems from either his ongoing shoulder pain or from the swelling on the back of his head.  Will image shoulders and c-spine. These have not been imaged per patient.  Will perform incision and drainage of likely infected cyst.   Vital signs reviewed and are as follows: BP (!) 143/110   Pulse 76   Temp 98.3 F (36.8 C) (Oral)   Resp 18   Ht 6\' 1"  (1.854 m)   Wt 115.7 kg (255 lb)   SpO2 97%   BMI 33.64 kg/m   X-rays reviewed and discussed with patient.  He will follow-up with his primary care doctor if pain in shoulders and neck persists.  Incision and drainage and occipital nerve block performed as above.  Patient with good pain relief after procedure.  We will discharged home at this time.  Patient to continue conservative measures.  Encouraged return to the emergency department if he develops any weakness in his extremities, severe uncontrolled pain, vision issues, new symptoms or other concerns.   Final Clinical Impressions(s) / ED Diagnoses   Final diagnoses:  Infected sebaceous cyst  Cervicalgia  Chronic pain of both shoulders   Patient with headache most consistent with cervicalgia.  This may  be referred pain from arthritis in neck and shoulders or possibly from infected sebaceous cyst noted today.  Cyst was drained with good results.  No associated surrounding cellulitis or need for antibiotics.  X-rays demonstrate arthritis as above.  Patient received good pain improvement with occipital nerve block.  Feel patient is stable for discharged home at this time.  He has a normal neuro exam and it is reassuring that symptoms improved with treatment here.   ED Discharge Orders    None       Renne Crigler, PA-C 01/11/18 1908    Melene Plan, DO 01/11/18 2254

## 2019-12-29 IMAGING — DX DG SHOULDER 2+V*L*
3 series · 3 of 3 positions shown · non-contrast
Comparison: None.

CLINICAL DATA: Pt with neck pain laterally radiating to bilateral
shoulders R>L, NKI, headaches, dizziness this am but none known.

EXAM:
LEFT SHOULDER - 2+ VIEW

[shoulder grashey]
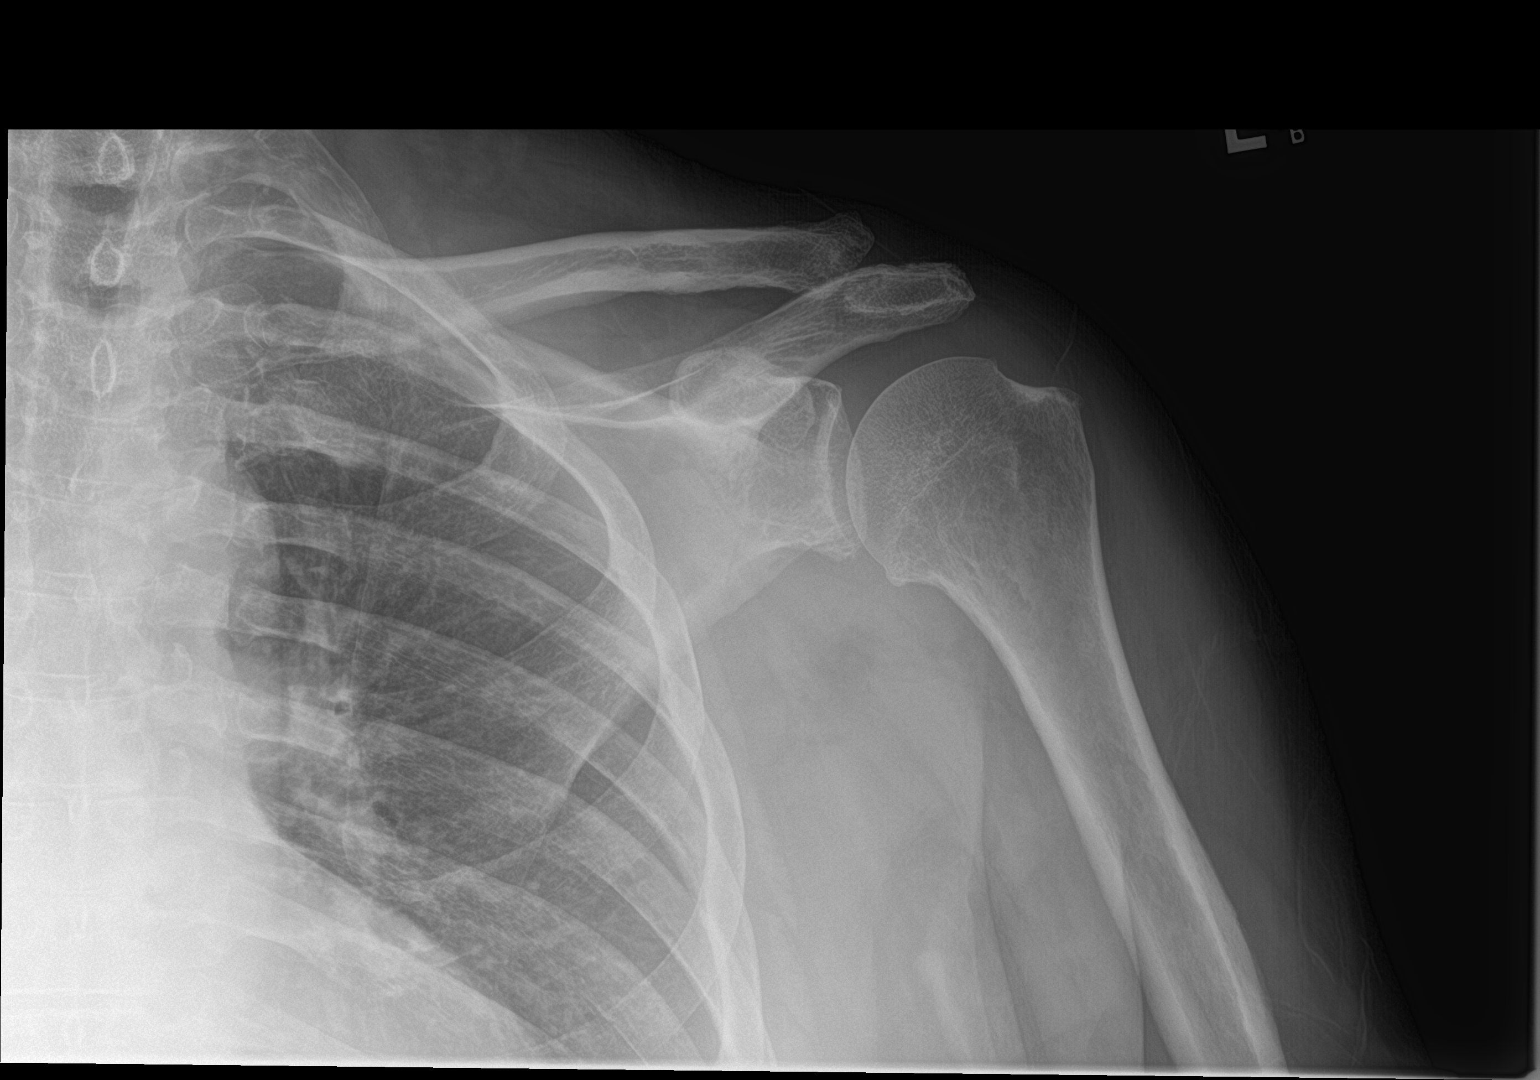

[shoulder y view]
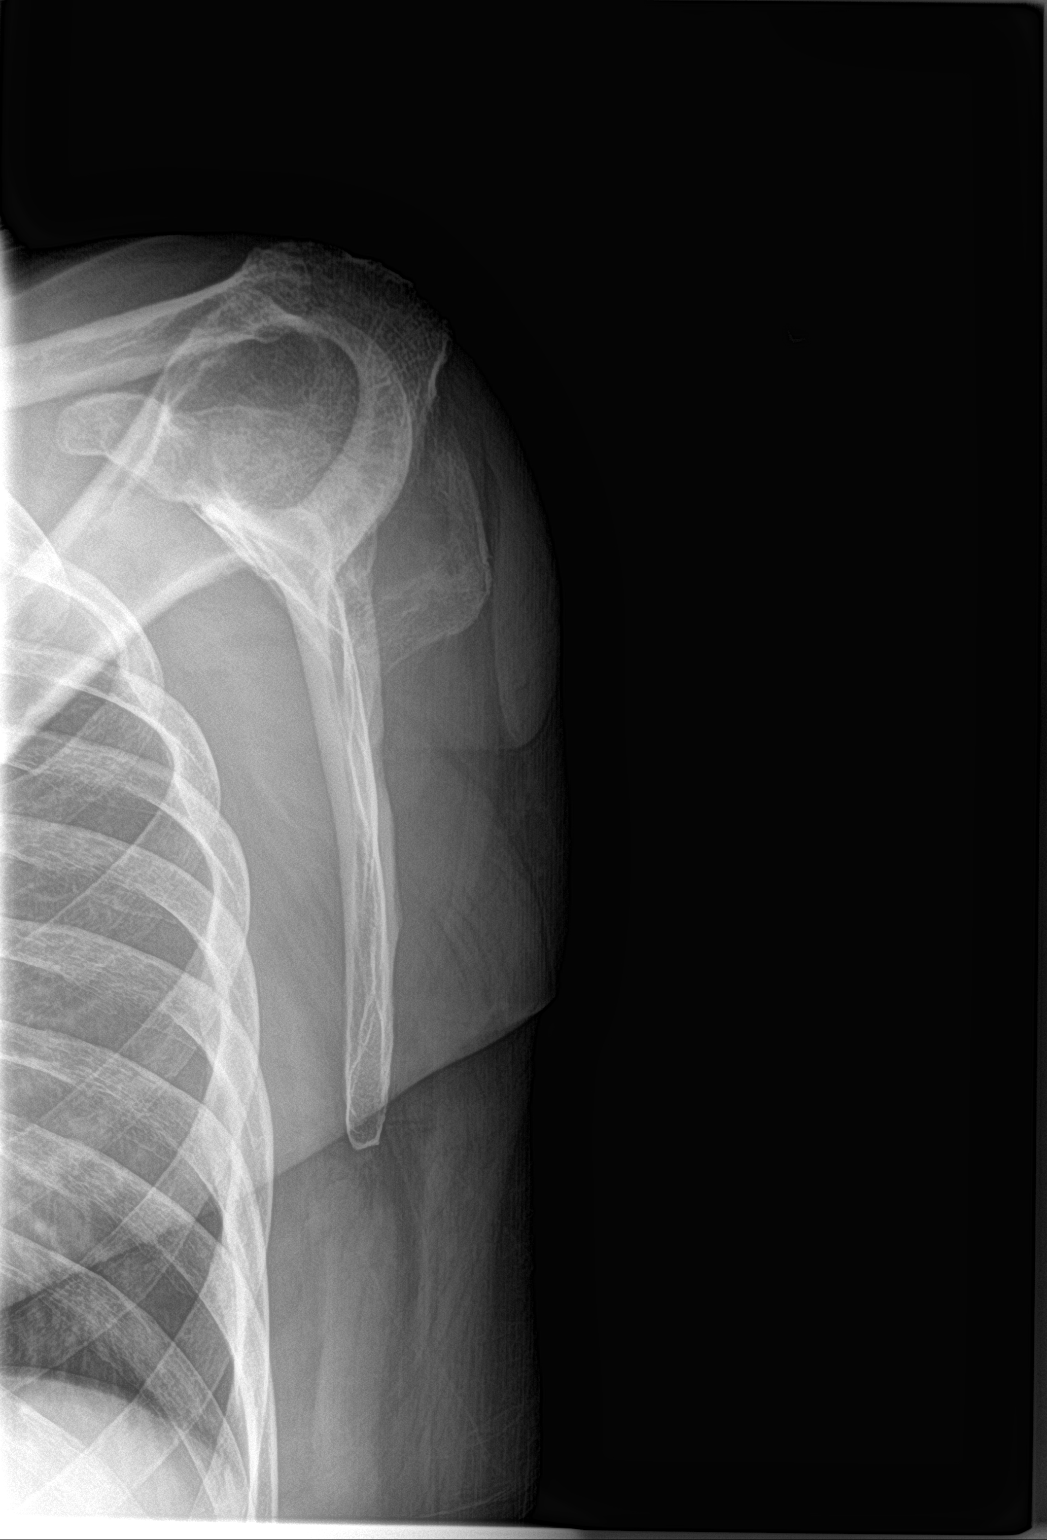

[shoulder axillary]
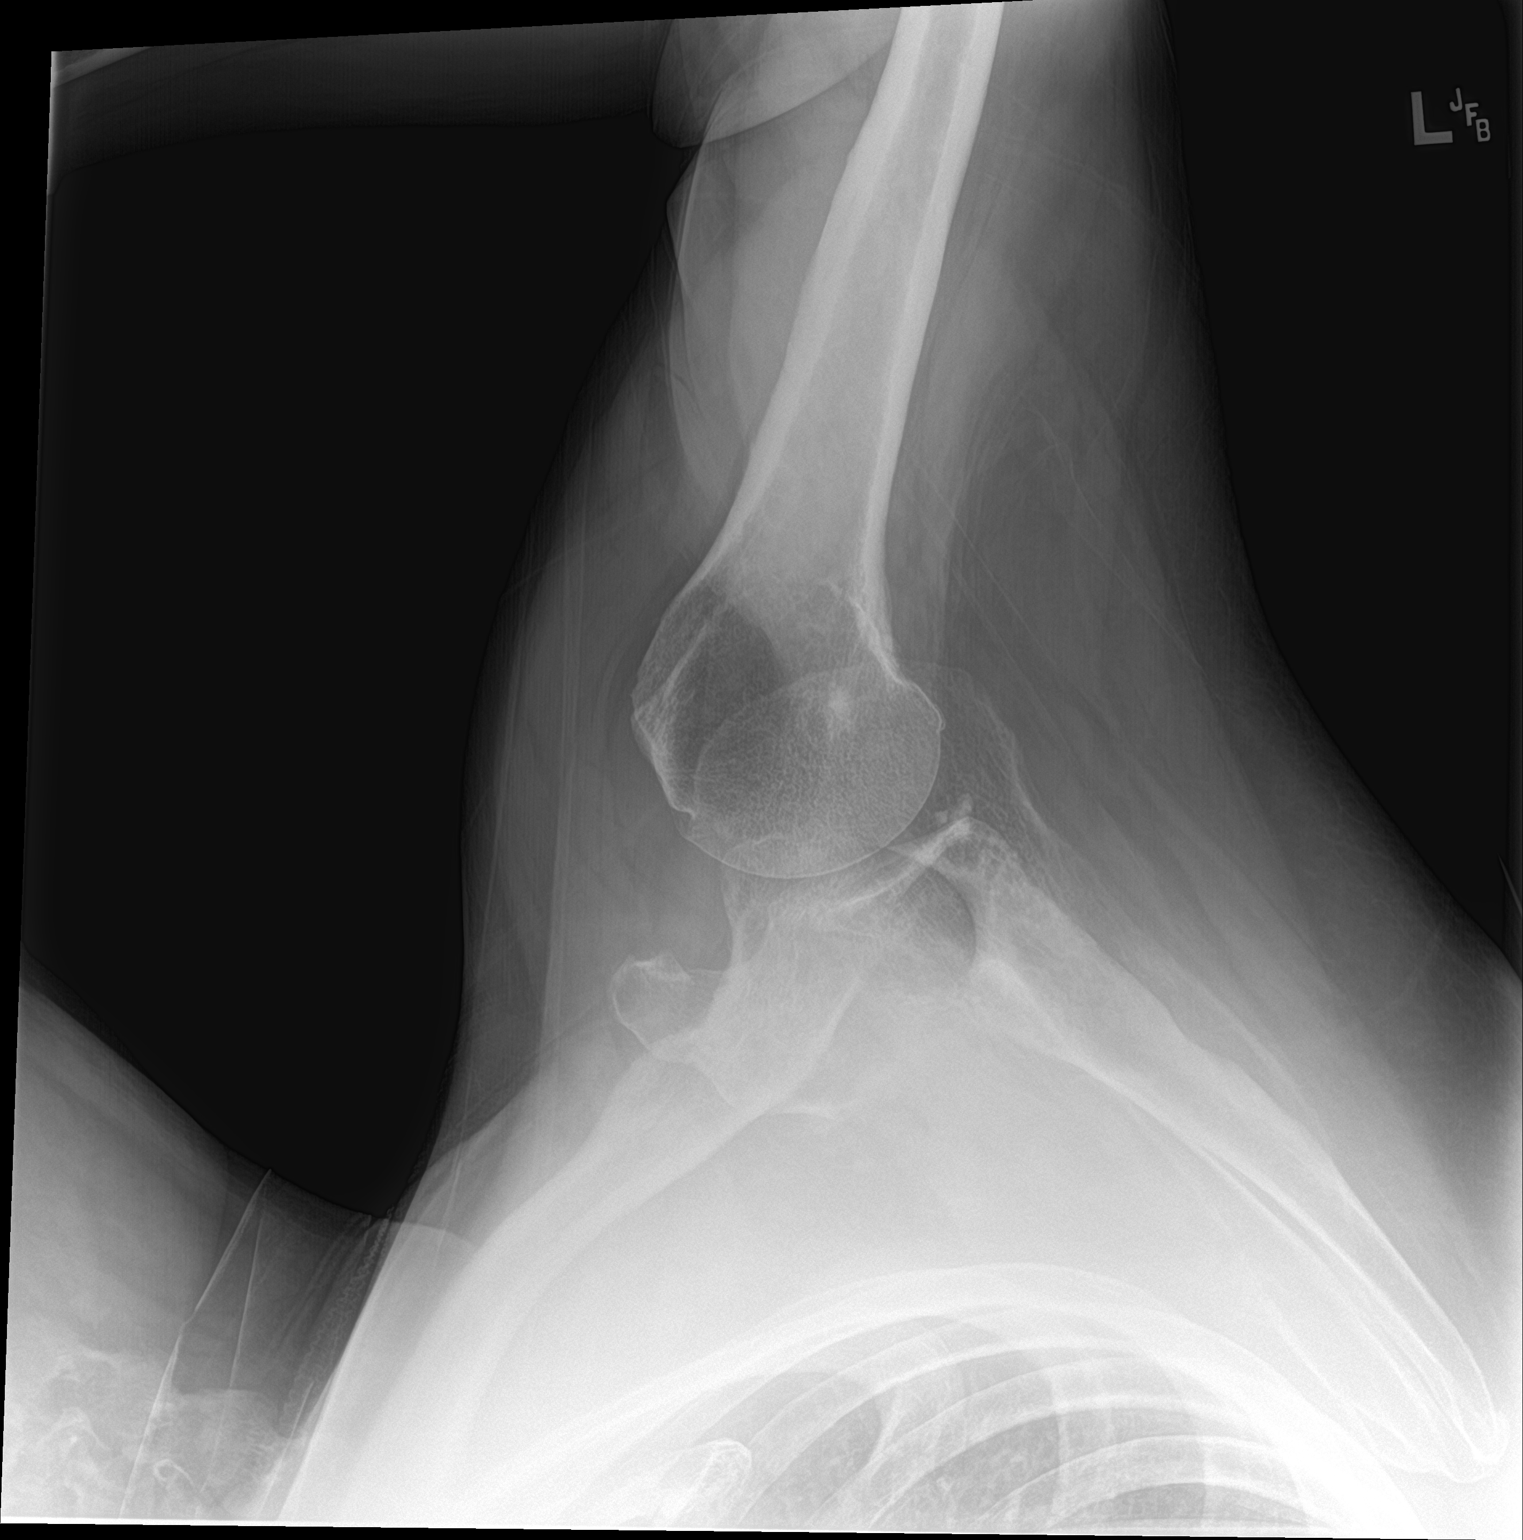

[3 of 3 positions shown; findings below may reference images not displayed]

FINDINGS: There is mild degenerative change at the glenohumeral joint. No
acute fracture or subluxation.
IMPRESSION: Mild degenerative changes.

No evidence for acute  abnormality.

## 2022-02-13 ENCOUNTER — Emergency Department (HOSPITAL_BASED_OUTPATIENT_CLINIC_OR_DEPARTMENT_OTHER)
Admission: EM | Admit: 2022-02-13 | Discharge: 2022-02-13 | Disposition: A | Discharge status: Home / Self Care | Payer: 59 | Attending: Emergency Medicine | Admitting: Emergency Medicine

## 2022-02-13 ENCOUNTER — Encounter (HOSPITAL_BASED_OUTPATIENT_CLINIC_OR_DEPARTMENT_OTHER): Payer: Self-pay

## 2022-02-13 ENCOUNTER — Other Ambulatory Visit: Payer: Self-pay

## 2022-02-13 ENCOUNTER — Emergency Department (HOSPITAL_BASED_OUTPATIENT_CLINIC_OR_DEPARTMENT_OTHER): Payer: 59

## 2022-02-13 DIAGNOSIS — W01198A Fall on same level from slipping, tripping and stumbling with subsequent striking against other object, initial encounter: Secondary | ICD-10-CM | POA: Insufficient documentation

## 2022-02-13 DIAGNOSIS — I4581 Long QT syndrome: Secondary | ICD-10-CM | POA: Diagnosis not present

## 2022-02-13 DIAGNOSIS — S20211A Contusion of right front wall of thorax, initial encounter: Secondary | ICD-10-CM | POA: Diagnosis not present

## 2022-02-13 DIAGNOSIS — Z7982 Long term (current) use of aspirin: Secondary | ICD-10-CM | POA: Diagnosis not present

## 2022-02-13 DIAGNOSIS — S0031XA Abrasion of nose, initial encounter: Secondary | ICD-10-CM | POA: Diagnosis not present

## 2022-02-13 DIAGNOSIS — S80212A Abrasion, left knee, initial encounter: Secondary | ICD-10-CM | POA: Diagnosis not present

## 2022-02-13 DIAGNOSIS — Z23 Encounter for immunization: Secondary | ICD-10-CM | POA: Diagnosis not present

## 2022-02-13 DIAGNOSIS — S299XXA Unspecified injury of thorax, initial encounter: Secondary | ICD-10-CM | POA: Diagnosis present

## 2022-02-13 DIAGNOSIS — T148XXA Other injury of unspecified body region, initial encounter: Secondary | ICD-10-CM

## 2022-02-13 MED ORDER — TETANUS-DIPHTH-ACELL PERTUSSIS 5-2.5-18.5 LF-MCG/0.5 IM SUSY
0.5000 mL | PREFILLED_SYRINGE | Freq: Once | INTRAMUSCULAR | Status: AC
  Administered 2022-02-13: 0.5 mL via INTRAMUSCULAR
  Filled 2022-02-13: qty 0.5

## 2022-02-13 MED ORDER — HYDROCODONE-ACETAMINOPHEN 5-325 MG PO TABS: 1.0000 | ORAL_TABLET | Freq: Four times a day (QID) | ORAL | 0 refills | Status: AC | PRN

## 2022-02-13 MED ORDER — HYDROCODONE-ACETAMINOPHEN 5-325 MG PO TABS: 1.0000 | ORAL_TABLET | Freq: Four times a day (QID) | ORAL | 0 refills | Status: DC | PRN

## 2022-02-13 NOTE — ED Provider Notes (Signed)
[facility] EMERGENCY DEPARTMENT Provider Note   CSN: [id number] Arrival date & time: 02/13/22  1555     History {Add pertinent medical, surgical, social history, OB history to HPI:1} Chief Complaint  Patient presents with  . Fall    Brendan Lee is a 61 y.o. male who presents emergency department with chief complaint of fall.  Patient fell 5 days ago when his foot got caught up on his flip-flop.  He fell forward hitting the edge of the air conditioner.  He suffered an abrasion to the left knee and left side of his nasal bone.  He also hit his rib cage on the anterior right rib area and states that since that time he has had severe rib pains, difficulty taking deep breaths.  He was unable to come until today and has had severe difficulty working.  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  He is a daily smoker.  He denies hemoptysis, loss of consciousness, dizziness.   Fall      Home Medications Prior to Admission medications   Medication Sig Start Date End Date Taking? Authorizing Provider  amLODipine (NORVASC) 5 MG tablet Take 5 mg by mouth daily.    [provider]  amLODipine (NORVASC) 5 MG tablet Take 1 tablet (5 mg total) by mouth daily. 05/15/15 05/14/16  Doug Sou, MD  aspirin 81 MG tablet Take 81 mg by mouth daily.    [provider]  atorvastatin (LIPITOR) 40 MG tablet Take 40 mg by mouth daily. 09/28/15   [provider]  hydrochlorothiazide (HYDRODIURIL) 25 MG tablet Take 25 mg by mouth daily.    [provider]  hydrochlorothiazide (HYDRODIURIL) 25 MG tablet Take 1 tablet (25 mg total) by mouth daily. 05/15/15 05/14/16  Doug Sou, MD  isosorbide mononitrate (IMDUR) 30 MG 24 hr tablet Take 30 mg by mouth daily.    [provider]  isosorbide mononitrate (IMDUR) 30 MG 24 hr tablet Take 1 tablet  (30 mg total) by mouth daily. 05/15/15 01/11/18  Doug Sou, MD  lisinopril (PRINIVIL,ZESTRIL) 20 MG tablet Take 1 tablet (20 mg total) by mouth daily. 05/15/15 05/14/16  Doug Sou, MD  LOSARTAN POTASSIUM PO Take by mouth.    [provider]  metoprolol (TOPROL-XL) 200 MG 24 hr tablet Take 200 mg by mouth daily.    [provider]  metoprolol (TOPROL-XL) 200 MG 24 hr tablet Take 1 tablet (200 mg total) by mouth daily. Take with or immediately following a meal. 05/15/15 05/14/16  Doug Sou, MD      Allergies    Patient has no known allergies.    Review of Systems   Review of Systems  Physical Exam Updated Vital Signs BP 107/83   Pulse 98   Temp 98.2 F (36.8 C) (Oral)   Resp 18   Ht 6\' 1"  (1.854 m)   Wt 98.9 kg   SpO2 97%   BMI 28.76 kg/m  Physical Exam Vitals and nursing note reviewed.  Constitutional:      General: He is not in acute distress.    Appearance: He is well-developed. He is not diaphoretic.  HENT:     Head: Normocephalic and atraumatic.  Eyes:     General: No scleral icterus.    Conjunctiva/sclera: Conjunctivae normal.  Cardiovascular:     Rate and Rhythm: Normal rate and regular rhythm.  Heart sounds: Normal heart sounds.  Pulmonary:     Effort: Pulmonary effort is normal. No respiratory distress.     Breath sounds: Normal breath sounds.  Chest:     Comments: Tenderness to palpation over the right anterior rib cage, no crepitus swelling bruising or step-off noted. Abdominal:     Palpations: Abdomen is soft.     Tenderness: There is no abdominal tenderness.  Musculoskeletal:     Cervical back: Normal range of motion and neck supple.  Skin:    General: Skin is warm and dry.     Comments: Old but well-healing abrasion to the left knee and left nasal bridge  Neurological:     Mental Status: He is alert.  Psychiatric:        Behavior: Behavior normal.    ED Results / Procedures / Treatments   Labs (all labs ordered  are listed, but only abnormal results are displayed) Labs Reviewed - No data to display  EKG None  Radiology DG Chest 2 View  Result Date: 02/13/2022 CLINICAL DATA:  Shortness of breath EXAM: CHEST - 2 VIEW COMPARISON:  05/15/2015 FINDINGS: The heart size and mediastinal contours are within normal limits. Both lungs are clear. Probable scarring at the left lung base. The visualized skeletal structures are unremarkable. IMPRESSION: No active cardiopulmonary disease. Electronically Signed   By: Jasmine Pang M.D.   On: 02/13/2022 16:24    Procedures Procedures  {Document cardiac monitor, telemetry assessment procedure when appropriate:1}  Medications Ordered in ED Medications - No data to display  ED Course/ Medical Decision Making/ A&P Clinical Course as of 02/13/22 1656  Wed Feb 13, 2022  1645 DG Chest 2 View I visualized and interpreted 2 view chest x-ray which shows no acute finding [AH]  1645 ED EKG [AH]  1645 EKG shows sinus rhythm at a rate of 80 with mild QT prolongation [AH]    Clinical Course User Index [AH] Arthor Captain, PA-C                           Medical Decision Making Amount and/or Complexity of Data Reviewed Radiology: ordered.   ***  {Document critical care time when appropriate:1} {Document review of labs and clinical decision tools ie heart score, Chads2Vasc2 etc:1}  {Document your independent review of radiology images, and any outside records:1} {Document your discussion with family members, caretakers, and with consultants:1} {Document social determinants of health affecting pt's care:1} {Document your decision making why or why not admission, treatments were needed:1} Final Clinical Impression(s) / ED Diagnoses Final diagnoses:  None    Rx / DC Orders ED Discharge Orders     None

## 2022-02-13 NOTE — ED Triage Notes (Signed)
Pt reports he had a mechanical fall on Friday while at work in which he fell onto an air conditioner. He reports right side rib cage pain, right side chest pain and left middle back pain. Pain with inspiration. Ambulatory with independent steady gait.

## 2022-02-13 NOTE — Discharge Instructions (Addendum)
Your EKG showed some the QT prolongation.  This is when a segment of your EKG is somewhat prolonged.  This can be transient and you should have a repeat EKG.  You may follow-up with a primary care doctor or with cardiology for your abnormal EKG.  I have given your referral.  I am also discharging you with the incentive spirometer.  Try to use this hourly to maintain good lung inflation.  You can take Motrin (ibuprofen)for pain along with the medication I am discharging with you with.DO not take tylenol.  You can also use a lidocaine patch on your chest for pain and discomfort.  Return for any new or worsening symptoms including coughing up blood severe shortness of breath severe chest pain.

## 2022-02-13 NOTE — ED Notes (Signed)
Patient transported to X-ray 

## 2022-02-13 NOTE — ED Notes (Signed)
Pt A&OX4 ambulatory at d/c with independent steady gait. Pt verbalized understanding of d/c instructions, use of incentive spirometer, prescription and follow up care.

## 2022-03-17 ENCOUNTER — Encounter (HOSPITAL_BASED_OUTPATIENT_CLINIC_OR_DEPARTMENT_OTHER): Payer: Self-pay | Admitting: Emergency Medicine

## 2022-03-17 ENCOUNTER — Emergency Department (HOSPITAL_BASED_OUTPATIENT_CLINIC_OR_DEPARTMENT_OTHER): Payer: 59 | Admitting: Radiology

## 2022-03-17 ENCOUNTER — Other Ambulatory Visit: Payer: Self-pay

## 2022-03-17 ENCOUNTER — Emergency Department (HOSPITAL_BASED_OUTPATIENT_CLINIC_OR_DEPARTMENT_OTHER)
Admission: EM | Admit: 2022-03-17 | Discharge: 2022-03-17 | Disposition: A | Discharge status: Home / Self Care | Payer: 59 | Attending: Student | Admitting: Student

## 2022-03-17 ENCOUNTER — Emergency Department (HOSPITAL_BASED_OUTPATIENT_CLINIC_OR_DEPARTMENT_OTHER): Payer: 59

## 2022-03-17 DIAGNOSIS — I5030 Unspecified diastolic (congestive) heart failure: Secondary | ICD-10-CM | POA: Diagnosis not present

## 2022-03-17 DIAGNOSIS — I110 Hypertensive heart disease with heart failure: Secondary | ICD-10-CM | POA: Insufficient documentation

## 2022-03-17 DIAGNOSIS — S0181XA Laceration without foreign body of other part of head, initial encounter: Secondary | ICD-10-CM

## 2022-03-17 DIAGNOSIS — I959 Hypotension, unspecified: Secondary | ICD-10-CM | POA: Diagnosis not present

## 2022-03-17 DIAGNOSIS — Z79899 Other long term (current) drug therapy: Secondary | ICD-10-CM | POA: Diagnosis not present

## 2022-03-17 DIAGNOSIS — R7402 Elevation of levels of lactic acid dehydrogenase (LDH): Secondary | ICD-10-CM | POA: Insufficient documentation

## 2022-03-17 DIAGNOSIS — F17210 Nicotine dependence, cigarettes, uncomplicated: Secondary | ICD-10-CM | POA: Diagnosis not present

## 2022-03-17 DIAGNOSIS — E119 Type 2 diabetes mellitus without complications: Secondary | ICD-10-CM | POA: Insufficient documentation

## 2022-03-17 DIAGNOSIS — Z7982 Long term (current) use of aspirin: Secondary | ICD-10-CM | POA: Diagnosis not present

## 2022-03-17 DIAGNOSIS — S022XXB Fracture of nasal bones, initial encounter for open fracture: Secondary | ICD-10-CM | POA: Diagnosis not present

## 2022-03-17 DIAGNOSIS — Z20822 Contact with and (suspected) exposure to covid-19: Secondary | ICD-10-CM | POA: Insufficient documentation

## 2022-03-17 DIAGNOSIS — S0993XA Unspecified injury of face, initial encounter: Secondary | ICD-10-CM | POA: Diagnosis present

## 2022-03-17 DIAGNOSIS — Y9389 Activity, other specified: Secondary | ICD-10-CM | POA: Diagnosis not present

## 2022-03-17 DIAGNOSIS — R7989 Other specified abnormal findings of blood chemistry: Secondary | ICD-10-CM | POA: Insufficient documentation

## 2022-03-17 LAB — COMPREHENSIVE METABOLIC PANEL
ALT: 12 U/L (ref 0–44)
AST: 23 U/L (ref 15–41)
Albumin: 4.1 g/dL (ref 3.5–5.0)
Alkaline Phosphatase: 106 U/L (ref 38–126)
Anion gap: 14 (ref 5–15)
BUN: 27 mg/dL — ABNORMAL HIGH (ref 6–20)
CO2: 24 mmol/L (ref 22–32)
Calcium: 9.5 mg/dL (ref 8.9–10.3)
Chloride: 98 mmol/L (ref 98–111)
Creatinine, Ser: 1.81 mg/dL — ABNORMAL HIGH (ref 0.61–1.24)
GFR, Estimated: 42 mL/min — ABNORMAL LOW (ref >60)
Glucose, Bld: 95 mg/dL (ref 70–99)
Potassium: 3.5 mmol/L (ref 3.5–5.1)
Sodium: 136 mmol/L (ref 135–145)
Total Bilirubin: 0.4 mg/dL (ref 0.3–1.2)
Total Protein: 8.2 g/dL — ABNORMAL HIGH (ref 6.5–8.1)

## 2022-03-17 LAB — CBC
HCT: 41.8 % (ref 39.0–52.0)
Hemoglobin: 14.4 g/dL (ref 13.0–17.0)
MCH: 32 pg (ref 26.0–34.0)
MCHC: 34.4 g/dL (ref 30.0–36.0)
MCV: 92.9 fL (ref 80.0–100.0)
Platelets: 212 10*3/uL (ref 150–400)
RBC: 4.5 MIL/uL (ref 4.22–5.81)
RDW: 14.8 % (ref 11.5–15.5)
WBC: 9.7 10*3/uL (ref 4.0–10.5)
nRBC: 0 % (ref 0.0–0.2)

## 2022-03-17 LAB — RESP PANEL BY RT-PCR (FLU A&B, COVID) ARPGX2
Influenza A by PCR: NEGATIVE
Influenza B by PCR: NEGATIVE
SARS Coronavirus 2 by RT PCR: NEGATIVE

## 2022-03-17 LAB — ETHANOL: Alcohol, Ethyl (B): 265 mg/dL — ABNORMAL HIGH (ref <10)

## 2022-03-17 LAB — LACTIC ACID, PLASMA: Lactic Acid, Venous: 2.5 mmol/L (ref 0.5–1.9)

## 2022-03-17 MED ORDER — LIDOCAINE HCL (PF) 1 % IJ SOLN
5.0000 mL | Freq: Once | INTRAMUSCULAR | Status: AC
Start: 2022-03-17 — End: 2022-03-17
  Administered 2022-03-17: 5 mL via INTRADERMAL
  Filled 2022-03-17: qty 5

## 2022-03-17 MED ORDER — CEFADROXIL 500 MG PO CAPS: 500.0000 mg | ORAL_CAPSULE | Freq: Two times a day (BID) | ORAL | 0 refills | Status: AC

## 2022-03-17 MED ORDER — LACTATED RINGERS IV BOLUS
1000.0000 mL | Freq: Once | INTRAVENOUS | Status: AC
  Administered 2022-03-17: 1000 mL via INTRAVENOUS

## 2022-03-17 MED ORDER — NALOXONE HCL 0.4 MG/ML IJ SOLN: 0.4000 mg | INTRAMUSCULAR | Status: DC | PRN

## 2024-01-31 IMAGING — DX DG CHEST 2V
2 series · 2 of 2 positions shown · non-contrast
Comparison: 05/15/2015

CLINICAL DATA: Shortness of breath

EXAM:
CHEST - 2 VIEW

[chest pa]
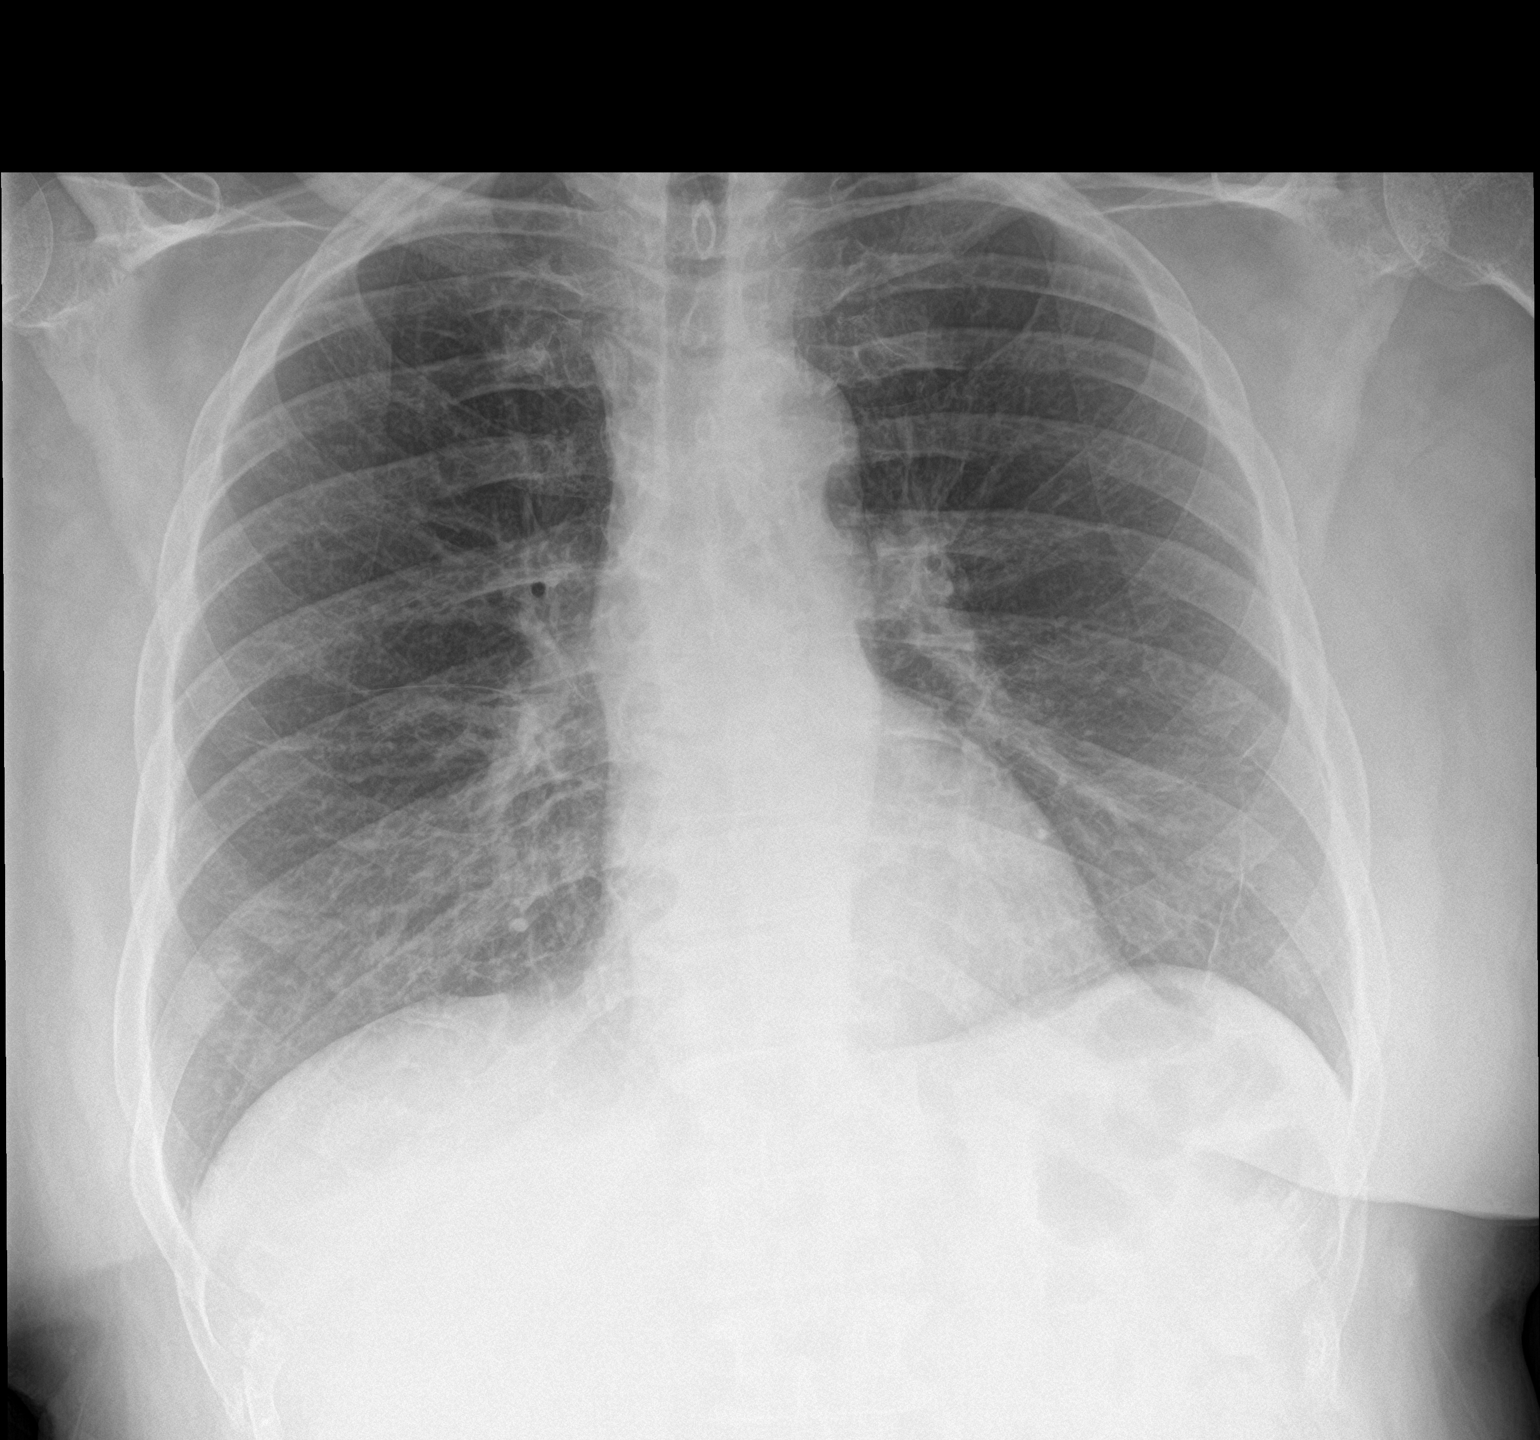

[chest lat]
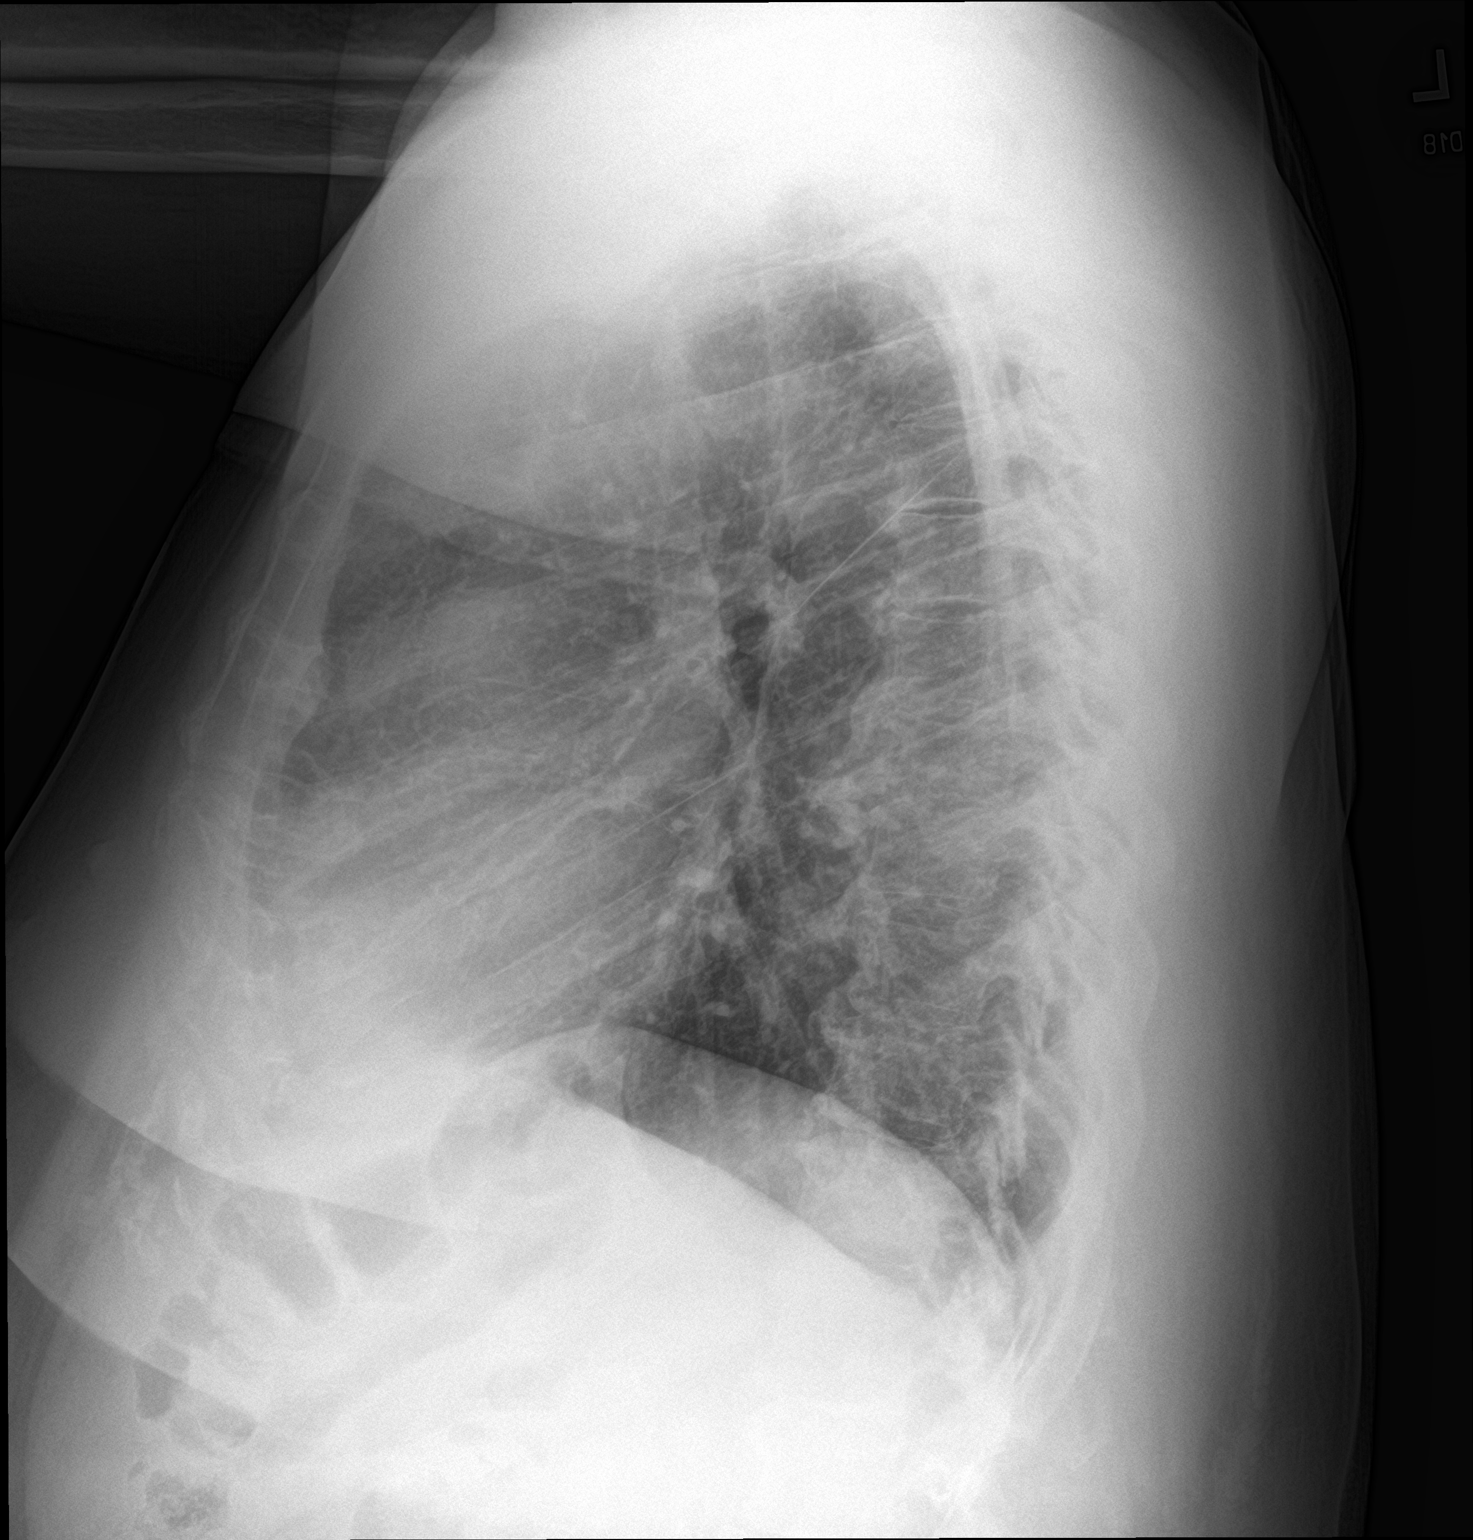

[2 of 2 positions shown; findings below may reference images not displayed]

FINDINGS: The heart size and mediastinal contours are within normal limits.
Both lungs are clear. Probable scarring at the left lung base. The
visualized skeletal structures are unremarkable.
IMPRESSION: No active cardiopulmonary disease.
